# Patient Record
Sex: Male | Born: 2000 | Race: Black or African American | Hispanic: No | Marital: Single | Smoking: Never smoker
Health system: Southern US, Community
[De-identification: ages and names within clinical notes are randomized; demographics above are authoritative.]

## PROBLEM LIST (undated history)

## (undated) DIAGNOSIS — F840 Autistic disorder: Secondary | ICD-10-CM

## (undated) DIAGNOSIS — F909 Attention-deficit hyperactivity disorder, unspecified type: Secondary | ICD-10-CM

## (undated) DIAGNOSIS — F32A Depression, unspecified: Secondary | ICD-10-CM

## (undated) DIAGNOSIS — K5900 Constipation, unspecified: Secondary | ICD-10-CM

## (undated) DIAGNOSIS — T7840XA Allergy, unspecified, initial encounter: Secondary | ICD-10-CM

## (undated) DIAGNOSIS — E785 Hyperlipidemia, unspecified: Secondary | ICD-10-CM

## (undated) DIAGNOSIS — F419 Anxiety disorder, unspecified: Secondary | ICD-10-CM

## (undated) HISTORY — DX: Anxiety disorder, unspecified: F41.9

## (undated) HISTORY — DX: Allergy, unspecified, initial encounter: T78.40XA

## (undated) HISTORY — PX: ADENOIDECTOMY: SUR15

## (undated) HISTORY — DX: Hyperlipidemia, unspecified: E78.5

## (undated) HISTORY — DX: Depression, unspecified: F32.A

---
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
## 2018-09-03 ENCOUNTER — Telehealth: Payer: Self-pay

## 2018-09-03 ENCOUNTER — Ambulatory Visit: Payer: Medicaid Other | Attending: Family Medicine | Admitting: Family Medicine

## 2018-09-03 ENCOUNTER — Encounter: Payer: Self-pay | Admitting: Family Medicine

## 2018-09-03 VITALS — BP 113/76 | HR 91 | Temp 98.6°F | Resp 16 | Ht 68.0 in | Wt 160.4 lb

## 2018-09-03 DIAGNOSIS — E010 Iodine-deficiency related diffuse (endemic) goiter: Secondary | ICD-10-CM

## 2018-09-03 DIAGNOSIS — R05 Cough: Secondary | ICD-10-CM | POA: Diagnosis not present

## 2018-09-03 DIAGNOSIS — Z8249 Family history of ischemic heart disease and other diseases of the circulatory system: Secondary | ICD-10-CM | POA: Insufficient documentation

## 2018-09-03 DIAGNOSIS — Z23 Encounter for immunization: Secondary | ICD-10-CM | POA: Diagnosis not present

## 2018-09-03 DIAGNOSIS — F840 Autistic disorder: Secondary | ICD-10-CM | POA: Diagnosis not present

## 2018-09-03 DIAGNOSIS — J309 Allergic rhinitis, unspecified: Secondary | ICD-10-CM

## 2018-09-03 DIAGNOSIS — J4521 Mild intermittent asthma with (acute) exacerbation: Secondary | ICD-10-CM

## 2018-09-03 DIAGNOSIS — Z800 Family history of malignant neoplasm of digestive organs: Secondary | ICD-10-CM | POA: Diagnosis not present

## 2018-09-03 DIAGNOSIS — J069 Acute upper respiratory infection, unspecified: Secondary | ICD-10-CM

## 2018-09-03 DIAGNOSIS — Z833 Family history of diabetes mellitus: Secondary | ICD-10-CM | POA: Insufficient documentation

## 2018-09-03 DIAGNOSIS — F909 Attention-deficit hyperactivity disorder, unspecified type: Secondary | ICD-10-CM | POA: Insufficient documentation

## 2018-09-03 DIAGNOSIS — Z79899 Other long term (current) drug therapy: Secondary | ICD-10-CM

## 2018-09-03 DIAGNOSIS — R058 Other specified cough: Secondary | ICD-10-CM

## 2018-09-03 MED ORDER — MONTELUKAST SODIUM 10 MG PO TABS: 10.0000 mg | ORAL_TABLET | Freq: Every day | ORAL | 11 refills | Status: DC

## 2018-09-03 MED ORDER — CETIRIZINE HCL 10 MG PO TABS: 10.0000 mg | ORAL_TABLET | Freq: Every day | ORAL | 11 refills | Status: DC

## 2018-09-03 MED ORDER — AZITHROMYCIN 250 MG PO TABS: ORAL_TABLET | ORAL | 0 refills | Status: DC

## 2018-09-03 MED ORDER — PREDNISONE 20 MG PO TABS: ORAL_TABLET | ORAL | 0 refills | Status: DC

## 2018-09-03 MED ORDER — ALBUTEROL SULFATE HFA 108 (90 BASE) MCG/ACT IN AERS: 2.0000 | INHALATION_SPRAY | Freq: Four times a day (QID) | RESPIRATORY_TRACT | 11 refills | Status: DC | PRN

## 2018-09-03 NOTE — Telephone Encounter (Signed)
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 Pt is to arrive @345pm .

## 2018-09-03 NOTE — Patient Instructions (Addendum)
Td Vaccine (Tetanus and Diphtheria): What You Need to Know 1. Why get vaccinated? Tetanus  and diphtheria are very serious diseases. They are rare in the [place] today, but people who do become infected often have severe complications. Td vaccine is used to protect adolescents and adults from both of these diseases. Both tetanus and diphtheria are infections caused by bacteria. Diphtheria spreads from person to person through coughing or sneezing. Tetanus-causing bacteria enter the body through cuts, scratches, or wounds. TETANUS (lockjaw) causes painful muscle tightening and stiffness, usually all over the body.  It can lead to tightening of muscles in the head and neck so you can't open your mouth, swallow, or sometimes even breathe. Tetanus kills about 1 out of every 10 people who are infected even after receiving the best medical care.  DIPHTHERIA can cause a thick coating to form in the back of the throat.  It can lead to breathing problems, paralysis, heart failure, and death.  Before vaccines, as many as 200,000 cases of diphtheria and hundreds of cases of tetanus were reported in the [place] each year. Since vaccination began, reports of cases for both diseases have dropped by about 99%. 2. Td vaccine Td vaccine can protect adolescents and adults from tetanus and diphtheria. Td is usually given as a booster dose every 10 years but it can also be given earlier after a severe and dirty wound or burn. Another vaccine, called Tdap, which protects against pertussis in addition to tetanus and diphtheria, is sometimes recommended instead of Td vaccine. Your doctor or the person giving you the vaccine can give you more information. Td may safely be given at the same time as other vaccines. 3. Some people should not get this vaccine  A person who has ever had a life-threatening allergic reaction after a previous dose of any tetanus or diphtheria containing vaccine, OR has a severe  allergy to any part of this vaccine, should not get Td vaccine. Tell the person giving the vaccine about any severe allergies.  Talk to your doctor if you: ? had severe pain or swelling after any vaccine containing diphtheria or tetanus, ? ever had a condition called Guillain Barre Syndrome (GBS), ? aren't feeling well on the day the shot is scheduled. 4. What are the risks from Td vaccine? With any medicine, including vaccines, there is a chance of side effects. These are usually mild and go away on their own. Serious reactions are also possible but are rare. Most people who get Td vaccine do not have any problems with it. Mild problems following Td vaccine: (Did not interfere with activities)  Pain where the shot was given (about 8 people in 10)  Redness or swelling where the shot was given (about 1 person in 4)  Mild fever (rare)  Headache (about 1 person in 4)  Tiredness (about 1 person in 4)  Moderate problems following Td vaccine: (Interfered with activities, but did not require medical attention)  Fever over 102F (rare)  Severe problems following Td vaccine: (Unable to perform usual activities; required medical attention)  Swelling, severe pain, bleeding and/or redness in the arm where the shot was given (rare).  Problems that could happen after any vaccine:  People sometimes faint after a medical procedure, including vaccination. Sitting or lying down for about 15 minutes can help prevent fainting, and injuries caused by a fall. Tell your doctor if you feel dizzy, or have vision changes or ringing in the ears.  Some people get   severe pain in the shoulder and have difficulty moving the arm where a shot was given. This happens very rarely.  Any medication can cause a severe allergic reaction. Such reactions from a vaccine are very rare, estimated at fewer than 1 in a million doses, and would happen within a few minutes to a few hours after the vaccination. As with any  medicine, there is a very remote chance of a vaccine causing a serious injury or death. The safety of vaccines is always being monitored. For more information, visit: www.cdc.gov/vaccinesafety/ 5. What if there is a serious reaction? What should I look for? Look for anything that concerns you, such as signs of a severe allergic reaction, very high fever, or unusual behavior. Signs of a severe allergic reaction can include hives, swelling of the face and throat, difficulty breathing, a fast heartbeat, dizziness, and weakness. These would usually start a few minutes to a few hours after the vaccination. What should I do?  If you think it is a severe allergic reaction or other emergency that can't wait, call 9-1-1 or get the person to the nearest hospital. Otherwise, call your doctor.  Afterward, the reaction should be reported to the Vaccine Adverse Event Reporting System (VAERS). Your doctor might file this report, or you can do it yourself through the VAERS web site at www.vaers.hhs.gov, or by calling 1-800-822-7967. ? VAERS does not give medical advice. 6. The National Vaccine Injury Compensation Program The National Vaccine Injury Compensation Program (VICP) is a federal program that was created to compensate people who may have been injured by certain vaccines. Persons who believe they may have been injured by a vaccine can learn about the program and about filing a claim by calling 1-800-338-2382 or visiting the VICP website at www.hrsa.gov/vaccinecompensation. There is a time limit to file a claim for compensation. 7. How can I learn more?  Ask your doctor. He or she can give you the vaccine package insert or suggest other sources of information.  Call your local or state health department.  Contact the Centers for Disease Control and Prevention (CDC): ? Call 1-800-232-4636 (1-800-CDC-INFO) ? Visit CDC's website at www.cdc.gov/vaccines CDC Td Vaccine VIS (03/18/16) This information is  not intended to replace advice given to you by your health care provider. Make sure you discuss any questions you have with your health care provider. Document Released: 09/21/2006 Document Revised: 08/14/2016 Document Reviewed: 08/14/2016 Elsevier Interactive Patient Education  2017 Else[employer] Influenza Virus Vaccine injection (Fluarix) What is this medicine? INFLUENZA VIRUS VACCINE (in floo EN zuh VAHY ruhs vak SEEN) helps to reduce the risk of getting influenza also known as the flu. This medicine may be used for other purposes; ask your health care provider or pharmacist if you have questions. COMMON BRAND NAME(S): Fluarix, Fluzone What should I tell my health care provider before I take this medicine? They need to know if you have any of these conditions: -bleeding disorder like hemophilia -fever or infection -Guillain-Barre syndrome or other neurological problems -immune system problems -infection with the human immunodeficiency virus (HIV) or AIDS -low blood platelet counts -multiple sclerosis -an unusual or allergic reaction to influenza virus vaccine, eggs, chicken proteins, latex, gentamicin, other medicines, foods, dyes or preservatives -pregnant or trying to get pregnant -breast-feeding How should I use this medicine? This vaccine is for injection into a muscle. It is given by a health care professional. A copy of Vaccine Information Statements will be given before each vaccination. Read this sheet carefully each   time. The sheet may change frequently. Talk to your pediatrician regarding the use of this medicine in children. Special care may be needed. Overdosage: If you think you have taken too much of this medicine contact a poison control center or emergency room at once. NOTE: This medicine is only for you. Do not share this medicine with others. What if I miss a dose? This does not apply. What may interact with this medicine? -chemotherapy or radiation  therapy -medicines that lower your immune system like etanercept, anakinra, infliximab, and adalimumab -medicines that treat or prevent blood clots like warfarin -phenytoin -steroid medicines like prednisone or cortisone -theophylline -vaccines This list may not describe all possible interactions. Give your health care provider a list of all the medicines, herbs, non-prescription drugs, or dietary supplements you use. Also tell them if you smoke, drink alcohol, or use illegal drugs. Some items may interact with your medicine. What should I watch for while using this medicine? Report any side effects that do not go away within 3 days to your doctor or health care professional. Call your health care provider if any unusual symptoms occur within 6 weeks of receiving this vaccine. You may still catch the flu, but the illness is not usually as bad. You cannot get the flu from the vaccine. The vaccine will not protect against colds or other illnesses that may cause fever. The vaccine is needed every year. What side effects may I notice from receiving this medicine? Side effects that you should report to your doctor or health care professional as soon as possible: -allergic reactions like skin rash, itching or hives, swelling of the face, lips, or tongue Side effects that usually do not require medical attention (report to your doctor or health care professional if they continue or are bothersome): -fever -headache -muscle aches and pains -pain, tenderness, redness, or swelling at site where injected -weak or tired This list may not describe all possible side effects. Call your doctor for medical advice about side effects. You may report side effects to FDA at 1-800-FDA-1088. Where should I keep my medicine? This vaccine is only given in a clinic, pharmacy, doctor's office, or other health care setting and will not be stored at home. NOTE: This sheet is a summary. It may not cover all possible  information. If you have questions about this medicine, talk to your doctor, pharmacist, or health care provider.  2018 Elsevier/Gold Standard (2008-06-21 09:30:40)  Asthma, Adult Asthma is a condition of the lungs in which the airways tighten and narrow. Asthma can make it hard to breathe. Asthma cannot be cured, but medicine and lifestyle changes can help control it. Asthma may be started (triggered) by:  Animal skin flakes (dander).  Dust.  Cockroaches.  Pollen.  Mold.  Smoke.  Cleaning products.  Hair sprays or aerosol sprays.  Paint fumes or strong smells.  Cold air, weather changes, and winds.  Crying or laughing hard.  Stress.  Certain medicines or drugs.  Foods, such as dried fruit, potato chips, and sparkling grape juice.  Infections or conditions (colds, flu).  Exercise.  Certain medical conditions or diseases.  Exercise or tiring activities.  Follow these instructions at home:  Take medicine as told by your doctor.  Use a peak flow meter as told by your doctor. A peak flow meter is a tool that measures how well the lungs are working.  Record and keep track of the peak flow meter's readings.  Understand and use the asthma action plan.  An asthma action plan is a written plan for taking care of your asthma and treating your attacks.  To help prevent asthma attacks: ? Do not smoke. Stay away from secondhand smoke. ? Change your heating and air conditioning filter often. ? Limit your use of fireplaces and wood stoves. ? Get rid of pests (such as roaches and mice) and their droppings. ? Throw away plants if you see mold on them. ? Clean your floors. Dust regularly. Use cleaning products that do not smell. ? Have someone vacuum when you are not home. Use a vacuum cleaner with a HEPA filter if possible. ? Replace carpet with wood, tile, or vinyl flooring. Carpet can trap animal skin flakes and dust. ? Use allergy-proof pillows, mattress covers, and box  spring covers. ? Wash bed sheets and blankets every week in hot water and dry them in a dryer. ? Use blankets that are made of polyester or cotton. ? Clean bathrooms and kitchens with bleach. If possible, have someone repaint the walls in these rooms with mold-resistant paint. Keep out of the rooms that are being cleaned and painted. ? Wash hands often. Contact a doctor if:  You have make a whistling sound when breaking (wheeze), have shortness of breath, or have a cough even if taking medicine to prevent attacks.  The colored mucus you cough up (sputum) is thicker than usual.  The colored mucus you cough up changes from clear or white to yellow, green, gray, or bloody.  You have problems from the medicine you are taking such as: ? A rash. ? Itching. ? Swelling. ? Trouble breathing.  You need reliever medicines more than 2-3 times a week.  Your peak flow measurement is still at 50-79% of your personal best after following the action plan for 1 hour.  You have a fever. Get help right away if:  You seem to be worse and are not responding to medicine during an asthma attack.  You are short of breath even at rest.  You get short of breath when doing very little activity.  You have trouble eating, drinking, or talking.  You have chest pain.  You have a fast heartbeat.  Your lips or fingernails start to turn blue.  You are light-headed, dizzy, or faint.  Your peak flow is less than 50% of your personal best. This information is not intended to replace advice given to you by your health care provider. Make sure you discuss any questions you have with your health care provider. Document Released: 05/12/2008 Document Revised: 05/01/2016 Document Reviewed: 06/23/2013 Elsevier Interactive Patient Education  2017 [employer].

## 2018-09-03 NOTE — Progress Notes (Signed)
Subjective:    Patient ID: Jesse Rhodes, male    DOB: 10-Mar-2001, 17 y.o.   MRN: [id number]  HPI 17 year old male who is new to the practice who presents to establish a primary care physician.  Patient is accompanied by his mother at today's visit.  Mother reports that the patient has a history of asthma, high functioning autism and attention deficit disorder.  Patient is seen by an asthma specialist as well as a specialist for his ADD.      Patient states that today's visit that he has had recurrent dry cough for a while but now is starting to cough up something and also felt last night as if he is having some wheezing.  Patient has had some nasal congestion and clear nasal discharge as well as sensation of postnasal drainage with mild throat irritation but no sore throat.   Patient with complaint of some mild sensation of chest tightness.  Per mother, refills are needed of albuterol and cetirizine.  Patient states that he has been using his albuterol more often.  Mother reports that patient had been on Qvar as well for his asthma in the past.  Patient was tried on Advair but had onset of rash and itching.  Upon questioning, patient was also on Singulair in the past and this did seem to help with his asthma and allergies.  Mother states that patient's only surgery has been removal of his adenoids when he was very young.  Patient has family history significant for his mother having diabetes and hypertension, maternal grandfather with hypertension and diabetes, maternal grandmother with hypertension and patient has 2 maternal uncles with diabetes.  Patient's mother states that her brother ( patient's maternal uncle) also had colon cancer at the age of 37.  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  Patient denies any smoking or alcohol use. Past Medical History:  Diagnosis Date  . ADHD (attention deficit hyperactivity disorder)   . Asthma     . Autism   . Constipation    Past Surgical History:  Procedure Laterality Date  . ADENOIDECTOMY     Social History   Tobacco Use  . Smoking status: Never Smoker  Substance Use Topics  . Alcohol use: No  . Drug use: No   Allergies  Allergen Reactions  . Advair Diskus [Fluticasone-Salmeterol] Rash  . Chocolate Hives, Itching and Rash  . Penicillins Rash     Review of Systems  Constitutional: Negative for chills and fever.  HENT: Positive for congestion and rhinorrhea. Negative for ear pain, sore throat and trouble swallowing.   Respiratory: Positive for cough, chest tightness, shortness of breath and wheezing.   Cardiovascular: Negative for chest pain, palpitations and leg swelling.  Gastrointestinal: Positive for constipation (occasional). Negative for abdominal pain and nausea.  Genitourinary: Negative for dysuria and frequency.  Musculoskeletal: Negative for back pain, gait problem and joint swelling.  Neurological: Negative for dizziness and headaches.       Objective:   Physical Exam BP 113/76   Pulse 91   Temp 98.6 F (37 C) (Oral)   Resp 16   Ht 5\' 8"  (1.727 m)   Wt 160 lb 6.4 oz (72.8 kg)   SpO2 98%   BMI 24.39 kg/m  vital signs and nurses notes reviewed General-well-nourished, well-developed young adult male in no acute distress.  Patient is accompanied by his mother at today's visit. ENT-TMs light pink, right greater  than left but landmarks are visible, nares with moderate edema of the nasal turbinates patient with clear to white nasal discharge, patient with mild posterior pharynx erythema Neck-supple, no lymphadenopathy, patient with thyroid fullness Cardiovascular-regular rate and rhythm Lungs-patient with a mild decrease in air movement and mild decrease in breath sounds but no wheeze and no accessory muscle use, breathing is unlabored Cardiovascular-regular rate and rhythm Abdomen-soft, nontender Back-no CVA tenderness Extremities-no edema      Assessment & Plan:  7. Need for immunization against influenza Patient was offered and received influenza immunization at today's visit. - Flu Vaccine QUAD 36+ mos IM  1. Mild intermittent asthma with exacerbation Patient with mild intermittent asthma but appears to have had a recent exacerbation as patient with complaint of dry cough and now with sensation of chest tightness and wheezing over the past few days.  Patient will be prescribed a prednisone taper which his mother states he has taken in the past with good results.  Patient will also be restarted on Singulair which mother states he has taken in the past and this is helped with asthma and allergies.  Mother states that she does have an appointment with his allergist but this is not until October.  Patient is given a refill of his albuterol.  If patient has onset of fever, productive nasal discharge or productive sputum with cough, prescription sent in for azithromycin Z-Pak and patient should follow-up next week for further evaluation. - montelukast (SINGULAIR) 10 MG tablet; Take 1 tablet (10 mg total) by mouth at bedtime.  Dispense: 30 tablet; Refill: 11 - CBC with Differential - Comprehensive metabolic panel - predniSONE (DELTASONE) 20 MG tablet; 2 pills once daily for 2 days then 1 pill daily for 2 days then 1/2 pill daily for 4 days; take after eating  Dispense: 8 tablet; Refill: 0 - albuterol (PROVENTIL HFA;VENTOLIN HFA) 108 (90 Base) MCG/ACT inhaler; Inhale 2 puffs into the lungs every 6 (six) hours as needed. For wheeze or shortness of breath  Dispense: 2 Inhaler; Refill: 11 - azithromycin (ZITHROMAX) 250 MG tablet; 2 pills on the first day then one pill daily for 4 days  Dispense: 6 tablet; Refill: 0  2. Recurrent dry cough Patient's cough is likely related to his current asthma exacerbation and/or current URI as well as chronic issues with allergic rhinitis.  Patient will be treated for asthma exacerbation with prednisone taper and  refill given of cetirizine for allergic rhinitis and patient restarted on Singulair.  Patient may need chest x-ray if cough does not resolve within the next few weeks.  3. URI with cough and congestion Patient with an upper respiratory infection with cough and congestion.  Prescription provided for cetirizine to help with allergic rhinitis and patient is being placed back on a Singulair for asthma and allergies.  Patient is also being treated for his asthma exacerbation.  Patient may also take an over-the-counter cough medicine such as Robitussin-DM if needed.  4. Allergic rhinitis, unspecified seasonality, unspecified trigger Prescription sent to patient's pharmacy for refill of cetirizine and patient will be restarted on Singulair.  5. Thyromegaly Patient with thyroid fullness and possible thyromegaly on exam.  Patient will have an ultrasound of the thyroid scheduled and will have TSH and free T4.  Patient will be notified of the results and if any further treatment is needed based on these results - US THYROID; Future - TSH + free T4  6. Encounter for long-term (current) use of medications Patient will have CMP  and CBC done a follow-up of long-term use of medications for treatment of multiple medical issues including his asthma and ADD. - CBC with Differential - Comprehensive metabolic panel  An After Visit Summary was printed and given to the patient.  Return in about 6 weeks (around 10/15/2018), or if symptoms worsen or fail to improve, for sooner if needed.

## 2018-09-04 LAB — CBC WITH DIFFERENTIAL/PLATELET
Basophils Absolute: 0 x10E3/uL (ref 0.0–0.2)
Basos: 1 %
EOS (ABSOLUTE): 0.1 x10E3/uL (ref 0.0–0.4)
Eos: 2 %
Hematocrit: 46.1 % (ref 37.5–51.0)
Hemoglobin: 15.9 g/dL (ref 13.0–17.7)
Immature Grans (Abs): 0 x10E3/uL (ref 0.0–0.1)
Immature Granulocytes: 0 %
Lymphocytes Absolute: 1.4 x10E3/uL (ref 0.7–3.1)
Lymphs: 47 %
MCH: 29.3 pg (ref 26.6–33.0)
MCHC: 34.5 g/dL (ref 31.5–35.7)
MCV: 85 fL (ref 79–97)
Monocytes Absolute: 0.4 x10E3/uL (ref 0.1–0.9)
Monocytes: 12 %
Neutrophils Absolute: 1.1 x10E3/uL — ABNORMAL LOW (ref 1.4–7.0)
Neutrophils: 38 %
Platelets: 235 x10E3/uL (ref 150–450)
RBC: 5.42 x10E6/uL (ref 4.14–5.80)
RDW: 13.1 % (ref 12.3–15.4)
WBC: 3 x10E3/uL — ABNORMAL LOW (ref 3.4–10.8)

## 2018-09-04 LAB — COMPREHENSIVE METABOLIC PANEL WITH GFR
ALT: 23 IU/L (ref 0–44)
AST: 19 IU/L (ref 0–40)
Albumin/Globulin Ratio: 2.5 — ABNORMAL HIGH (ref 1.2–2.2)
Albumin: 4.8 g/dL (ref 3.5–5.5)
Alkaline Phosphatase: 64 IU/L (ref 39–117)
BUN/Creatinine Ratio: 16 (ref 9–20)
BUN: 16 mg/dL (ref 6–20)
Bilirubin Total: 0.6 mg/dL (ref 0.0–1.2)
CO2: 24 mmol/L (ref 20–29)
Calcium: 9.6 mg/dL (ref 8.7–10.2)
Chloride: 102 mmol/L (ref 96–106)
Creatinine, Ser: 1.01 mg/dL (ref 0.76–1.27)
GFR calc Af Amer: 124 mL/min/1.73
GFR calc non Af Amer: 107 mL/min/1.73
Globulin, Total: 1.9 g/dL (ref 1.5–4.5)
Glucose: 69 mg/dL (ref 65–99)
Potassium: 4.2 mmol/L (ref 3.5–5.2)
Sodium: 143 mmol/L (ref 134–144)
Total Protein: 6.7 g/dL (ref 6.0–8.5)

## 2018-09-04 LAB — TSH+FREE T4
Free T4: 1.11 ng/dL (ref 0.93–1.60)
TSH: 4.13 u[IU]/mL (ref 0.450–4.500)

## 2018-09-07 ENCOUNTER — Ambulatory Visit (HOSPITAL_COMMUNITY)
Admission: RE | Admit: 2018-09-07 | Discharge: 2018-09-07 | Disposition: A | Discharge status: Home / Self Care | Payer: Medicaid Other | Source: Ambulatory Visit | Attending: Family Medicine | Admitting: Family Medicine

## 2018-09-07 ENCOUNTER — Telehealth: Payer: Self-pay

## 2018-09-07 DIAGNOSIS — E010 Iodine-deficiency related diffuse (endemic) goiter: Secondary | ICD-10-CM | POA: Diagnosis present

## 2018-09-07 NOTE — Telephone Encounter (Signed)
I would still like for her to get the US done. If it was not done today then she can get this rescheduled at her convience

## 2018-09-07 NOTE — Telephone Encounter (Signed)
Patient was called, spoke to the patients mother, she verified the patients dob. Results were given, mother verbalized understanding, and she had questioned if she still needed to take him to his thyroid US appointment today at 4pm due to thyroid blood test being normal? Please fu at your earliest convenience.

## 2018-09-07 NOTE — Telephone Encounter (Signed)
-----   Message from Cain Saupe, MD sent at 09/06/2018  9:34 PM EDT ----- Please notify patient's mother that patient has a low wbc count on his CBC at 3.0 (normal 3.4-10.8); can she bring patient in for lab visit to repeat CBC sometime this week or next week? (Let me know so that I can place the lab order). BMP and thyroid blood tests were normal

## 2018-09-07 NOTE — Telephone Encounter (Signed)
[place] was completed today.

## 2018-09-07 NOTE — Telephone Encounter (Signed)
-----   Message from Cain Saupe, MD sent at 09/07/2018  5:13 PM EDT ----- Please let patient's mom know that his thyroid US was normal

## 2018-09-07 NOTE — Telephone Encounter (Signed)
Patients mother was called and informed of the patients [place] results. She verbalized understanding and had no further questions.

## 2018-09-13 ENCOUNTER — Ambulatory Visit: Payer: Medicaid Other | Attending: Family Medicine

## 2018-09-13 DIAGNOSIS — Z79899 Other long term (current) drug therapy: Secondary | ICD-10-CM | POA: Insufficient documentation

## 2018-09-13 DIAGNOSIS — J4521 Mild intermittent asthma with (acute) exacerbation: Secondary | ICD-10-CM | POA: Diagnosis present

## 2018-09-13 NOTE — Progress Notes (Signed)
Patient here for lab visit only 

## 2018-09-14 LAB — CBC WITH DIFFERENTIAL/PLATELET
Basophils Absolute: 0 x10E3/uL (ref 0.0–0.2)
Basos: 1 %
EOS (ABSOLUTE): 0.1 x10E3/uL (ref 0.0–0.4)
Eos: 3 %
Hematocrit: 44.9 % (ref 37.5–51.0)
Hemoglobin: 16 g/dL (ref 13.0–17.7)
Immature Grans (Abs): 0 x10E3/uL (ref 0.0–0.1)
Immature Granulocytes: 0 %
Lymphocytes Absolute: 1.9 x10E3/uL (ref 0.7–3.1)
Lymphs: 49 %
MCH: 30.6 pg (ref 26.6–33.0)
MCHC: 35.6 g/dL (ref 31.5–35.7)
MCV: 86 fL (ref 79–97)
Monocytes Absolute: 0.4 x10E3/uL (ref 0.1–0.9)
Monocytes: 10 %
Neutrophils Absolute: 1.4 x10E3/uL (ref 1.4–7.0)
Neutrophils: 37 %
Platelets: 210 x10E3/uL (ref 150–450)
RBC: 5.23 x10E6/uL (ref 4.14–5.80)
RDW: 13.2 % (ref 12.3–15.4)
WBC: 3.9 x10E3/uL (ref 3.4–10.8)

## 2018-09-17 ENCOUNTER — Telehealth: Payer: Self-pay | Admitting: *Deleted

## 2018-09-17 NOTE — Telephone Encounter (Signed)
-----   Message from Cain Saupe, MD sent at 09/16/2018  2:40 PM EDT ----- Notify patient's mom that his CBC is normal

## 2018-09-17 NOTE — Telephone Encounter (Signed)
Patient's mother verified DOB. Aware of blood count being normal.  No further questions.

## 2018-10-11 ENCOUNTER — Ambulatory Visit: Payer: Medicaid Other | Admitting: Family Medicine

## 2018-10-21 ENCOUNTER — Ambulatory Visit: Payer: Medicaid Other | Attending: Family Medicine | Admitting: Family Medicine

## 2018-10-21 ENCOUNTER — Encounter: Payer: Self-pay | Admitting: Family Medicine

## 2018-10-21 VITALS — BP 131/78 | HR 76 | Temp 97.9°F | Ht 68.0 in | Wt 163.0 lb

## 2018-10-21 DIAGNOSIS — H6693 Otitis media, unspecified, bilateral: Secondary | ICD-10-CM

## 2018-10-21 DIAGNOSIS — Z79899 Other long term (current) drug therapy: Secondary | ICD-10-CM | POA: Diagnosis not present

## 2018-10-21 DIAGNOSIS — J4541 Moderate persistent asthma with (acute) exacerbation: Secondary | ICD-10-CM

## 2018-10-21 DIAGNOSIS — F840 Autistic disorder: Secondary | ICD-10-CM | POA: Diagnosis not present

## 2018-10-21 DIAGNOSIS — Z8249 Family history of ischemic heart disease and other diseases of the circulatory system: Secondary | ICD-10-CM | POA: Insufficient documentation

## 2018-10-21 DIAGNOSIS — R05 Cough: Secondary | ICD-10-CM | POA: Diagnosis not present

## 2018-10-21 DIAGNOSIS — J45909 Unspecified asthma, uncomplicated: Secondary | ICD-10-CM | POA: Insufficient documentation

## 2018-10-21 DIAGNOSIS — F909 Attention-deficit hyperactivity disorder, unspecified type: Secondary | ICD-10-CM | POA: Diagnosis not present

## 2018-10-21 DIAGNOSIS — R058 Other specified cough: Secondary | ICD-10-CM

## 2018-10-21 MED ORDER — AZITHROMYCIN 250 MG PO TABS: ORAL_TABLET | ORAL | 0 refills | Status: DC

## 2018-10-21 MED ORDER — PREDNISONE 20 MG PO TABS: 20.0000 mg | ORAL_TABLET | Freq: Every day | ORAL | 1 refills | Status: DC

## 2018-10-21 NOTE — Patient Instructions (Signed)
Ask allergist about an inhaled steroid to help with patient's recurrent cough and chest tightness

## 2018-10-21 NOTE — Progress Notes (Signed)
Patient stated his chest tightness is still there and cough.

## 2018-10-21 NOTE — Progress Notes (Signed)
Subjective:    Patient ID: Jesse Rhodes, male    DOB: [birth year], 17 y.o.   MRN: [id number]  HPI 17 year old male who presents in follow-up of asthma as patient with continued complaint of cough and chest tightness per CMA.  Patient's mother states the patient was seen by his allergist since his last visit here.  Mother states that the allergist believes that the cough may be related to environmental exposure.  Mother states that allergy shots have been recommended.       Patient reports that he continues to have a daily, nonproductive cough along with chest tightness.  Patient uses his albuterol inhaler several times per day.  Patient has restarted the use of Singulair since his last visit.  Patient does have some nasal congestion but does not feel as if he has any drainage down the back of his throat.  Patient denies any ear pain and no sore throat.  Past Medical History:  Diagnosis Date  . ADHD (attention deficit hyperactivity disorder)   . Asthma   . Autism   . Constipation    Past Surgical History:  Procedure Laterality Date  . ADENOIDECTOMY     Family History  Problem Relation Age of Onset  . Diabetes Mother   . Hypertension Mother    Social History   Tobacco Use  . Smoking status: Never Smoker  . Smokeless tobacco: Never Used  Substance Use Topics  . Alcohol use: Never    Frequency: Never  . Drug use: Never   Allergies  Allergen Reactions  . Advair Diskus [Fluticasone-Salmeterol] Rash  . Chocolate Hives, Itching and Rash  . Penicillins Rash    Review of Systems  Constitutional: Negative for chills, fatigue and fever.  HENT: Positive for congestion. Negative for ear pain, postnasal drip, rhinorrhea, sinus pressure, sinus pain, sore throat and trouble swallowing.   Respiratory: Positive for cough, chest tightness, shortness of breath (occasional) and wheezing.   Cardiovascular: Negative for chest pain, palpitations and leg swelling.  Gastrointestinal: Negative for  abdominal pain and nausea.  Endocrine: Negative for polydipsia, polyphagia and polyuria.  Genitourinary: Negative for dysuria and frequency.  Neurological: Negative for dizziness and headaches.       Objective:   Physical Exam BP 131/78 (BP Location: Right Arm, Patient Position: Sitting, Cuff Size: Normal)   Pulse 76   Temp 97.9 F (36.6 C) (Oral)   Ht 5\' 8"  (1.727 m)   Wt 163 lb (73.9 kg)   SpO2 99%   BMI 24.78 kg/m  Nurse's notes and vital signs reviewed General-well-nourished, well-developed young adult male accompanied by his mother at today's visit ENT- TMs are dark pink bilaterally and slightly thickened and no visible landmarks, right greater than left.  Patient with edema/erythema of the nasal turbinates with scant left-sided yellow nasal discharge, Patient with posterior pharynx/tonsillar arch edema/erythema and cobblestoning Neck-supple, patient with anterior neck fat padding, no LAD Lungs- patient with some decrease in air movement, no audible wheeze, no increased work of breathing Cardiovascular-regular rate and rhythm Abdomen-soft, nontender       Assessment & Plan:  1. Asthma in adult, moderate persistent, with acute exacerbation Patient with continued complaints of increased use of albuterol inhaler, chest tightness and cough.  Mother states that allergist is considering allergy shots.  Also discussed with mother that patient may benefit from an inhaled steroid and she should discuss this with the allergist.  Patient has had prior allergy to Advair which resulted in a rash.  Patient however  has been able to tolerate prednisone therefore can likely use an inhaled steroid which would help with his chest tightness.  Patient is prescribed a short course of prednisone for current chest tightness.  2. Recurrent cough Cough is likely secondary to combination of airway irritation and allergic rhinitis.  Mother reports that they have seen the allergist last month and allergist  is considering allergy shots.  However also suggested that patient's mother contact the allergist to see if patient might be able to tolerate an inhaled steroid to help  3. Bilateral otitis media, unspecified otitis media type Patient with bilateral otitis media but is asymptomatic.  Patient's mother was told that she may want to monitor patient's temperature and give antibiotic if he is running a fever or if he starts complaining of the ear pain.  Prescription was sent into patient's pharmacy for azithromycin Z-Pak for treatment of otitis media.  An After Visit Summary was printed and given to the patient.  Return in about 2 weeks (around 11/04/2018) for Asthma/ear infection-1 to 2 weeks if not better.

## 2018-11-01 ENCOUNTER — Ambulatory Visit: Payer: Medicaid Other | Attending: Family Medicine | Admitting: Family Medicine

## 2018-11-01 VITALS — BP 119/74 | HR 85 | Temp 98.1°F | Ht 68.0 in | Wt 163.6 lb

## 2018-11-01 DIAGNOSIS — F909 Attention-deficit hyperactivity disorder, unspecified type: Secondary | ICD-10-CM | POA: Diagnosis not present

## 2018-11-01 DIAGNOSIS — J069 Acute upper respiratory infection, unspecified: Secondary | ICD-10-CM | POA: Diagnosis not present

## 2018-11-01 DIAGNOSIS — J4541 Moderate persistent asthma with (acute) exacerbation: Secondary | ICD-10-CM | POA: Diagnosis not present

## 2018-11-01 DIAGNOSIS — Z888 Allergy status to other drugs, medicaments and biological substances status: Secondary | ICD-10-CM | POA: Diagnosis not present

## 2018-11-01 DIAGNOSIS — F840 Autistic disorder: Secondary | ICD-10-CM | POA: Insufficient documentation

## 2018-11-01 DIAGNOSIS — Z79899 Other long term (current) drug therapy: Secondary | ICD-10-CM | POA: Insufficient documentation

## 2018-11-01 DIAGNOSIS — J45909 Unspecified asthma, uncomplicated: Secondary | ICD-10-CM | POA: Diagnosis present

## 2018-11-01 DIAGNOSIS — Z880 Allergy status to penicillin: Secondary | ICD-10-CM | POA: Diagnosis not present

## 2018-11-01 DIAGNOSIS — J309 Allergic rhinitis, unspecified: Secondary | ICD-10-CM

## 2018-11-01 DIAGNOSIS — R05 Cough: Secondary | ICD-10-CM | POA: Diagnosis not present

## 2018-11-01 DIAGNOSIS — R053 Chronic cough: Secondary | ICD-10-CM

## 2018-11-01 MED ORDER — CETIRIZINE HCL 10 MG PO TABS: 10.0000 mg | ORAL_TABLET | Freq: Every day | ORAL | 11 refills | Status: DC

## 2018-11-01 MED ORDER — BENZONATATE 100 MG PO CAPS: 100.0000 mg | ORAL_CAPSULE | Freq: Two times a day (BID) | ORAL | 0 refills | Status: DC | PRN

## 2018-11-01 MED ORDER — PREDNISONE 20 MG PO TABS: ORAL_TABLET | ORAL | 0 refills | Status: DC

## 2018-11-01 NOTE — Progress Notes (Signed)
Subjective:    Patient ID: Jesse Rhodes, male    DOB: 11-Oct-2001, 17 y.o.   MRN: [id number]  HPI       17 year old autistic male seen in follow-up of asthma and chronic cough.  Patient is accompanied by his mother at today's visit.  Both patient and mother state that patient has continued to have a recurrent cough.  Mother did contact patient's allergist and patient is now on Flovent 110 mcg as well as the Singulair.  Patient states that he has not felt a big difference as he continues to cough.  Patient has not coughed up anything.  Patient denies fever or chills.  Patient has to use his albuterol inhaler several times per day.  Mother states that both she and the allergist believes that patient may have a work-related exposure notices contributing to his recurrent cough.  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  Patient denies any current ear pain or sore throat.  Patient does have nasal congestion and postnasal drainage but takes cetirizine.  Past Medical History:  Diagnosis Date  . ADHD (attention deficit hyperactivity disorder)   . Asthma   . Autism   . Constipation    Past Surgical History:  Procedure Laterality Date  . ADENOIDECTOMY     Family History  Problem Relation Age of Onset  . Diabetes Mother   . Hypertension Mother    Social History   Tobacco Use  . Smoking status: Never Smoker  . Smokeless tobacco: Never Used  Substance Use Topics  . Alcohol use: Never    Frequency: Never  . Drug use: Never   Allergies  Allergen Reactions  . Advair Diskus [Fluticasone-Salmeterol] Rash  . Chocolate Hives, Itching and Rash  . Penicillins Rash   Current Outpatient Medications on File Prior to Visit  Medication Sig Dispense Refill  . albuterol (PROVENTIL HFA;VENTOLIN HFA) 108 (90 Base) MCG/ACT inhaler Inhale 2 puffs into the lungs every 6 (six) hours as needed. For wheeze or shortness of breath 2 Inhaler 11  .  albuterol (PROVENTIL) (2.5 MG/3ML) 0.083% nebulizer solution Take 2.5 mg by nebulization every 6 (six) hours as needed. For shortness of breath     . montelukast (SINGULAIR) 10 MG tablet Take 1 tablet (10 mg total) by mouth at bedtime. 30 tablet 11  . azithromycin (ZITHROMAX) 250 MG tablet 2 pills on the first day then one pill daily for 4 days (Patient not taking: Reported on 11/01/2018) 6 tablet 0  . azithromycin (ZITHROMAX) 250 MG tablet Take 2 pills on the first day then 1 pill daily for the next 4 days (Patient not taking: Reported on 11/01/2018) 6 tablet 0  . methocarbamol (ROBAXIN) 500 MG tablet Take 1 tablet (500 mg total) by mouth 2 (two) times daily. (Patient not taking: Reported on 09/03/2018) 20 tablet 0  . methylphenidate (CONCERTA) 27 MG CR tablet Take 27 mg by mouth every morning.     . Pediatric Multiple Vit-C-FA (MULTIVITAMIN ANIMAL SHAPES, WITH CA/FA,) WITH C & FA CHEW Chew 1 tablet by mouth daily.    . predniSONE (DELTASONE) 20 MG tablet Take 1 tablet (20 mg total) by mouth daily with breakfast. (Patient not taking: Reported on 11/01/2018) 8 tablet 1   No current facility-administered medications on file prior to visit.       Review of Systems  Constitutional: Positive for fatigue. Negative for chills and fever.  HENT: Positive for congestion. Negative for  postnasal drip and rhinorrhea.   Respiratory: Positive for cough and chest tightness. Negative for shortness of breath.   Cardiovascular: Negative for chest pain, palpitations and leg swelling.  Gastrointestinal: Negative for abdominal distention and nausea.  Genitourinary: Negative for dysuria and frequency.  Musculoskeletal: Negative for arthralgias, back pain and myalgias.  Neurological: Negative for dizziness and headaches.       Objective:   Physical Exam BP 119/74   Pulse (!) 108   Temp 98.1 F (36.7 C) (Oral)   Ht 5\' 8"  (1.727 m)   Wt 163 lb 9.6 oz (74.2 kg)   SpO2 93%   BMI 24.88 kg/m  Nurse's notes and  vital signs reviewed General-well-nourished, well-developed young adult male in no acute distress sitting on exam table, patient does appear slightly fatigued.  Patient does not cough during his time in the exam room.  Patient is accompanied by his mother at today's visit ENT- right TM is light pink but landmarks are visible, nares with marked edema of the nasal turbinates with mild erythema and patient with thicker mucoid clear to grayish discharge in the canals, patient with tonsillar arch edema/erythema and posterior pharynx cobblestoning.  Neck-supple, no cervical lymphadenopathy, patient does have some mild thyroid fullness but no tenderness Lungs- patient with mild decreased breath sounds and very mild coarse breath sounds without wheeze, no increased work of breathing Cardiovascular-regular rate and rhythm Abdomen-soft, nontender Back-no CVA tenderness Extremities-no edema        Assessment & Plan:  1. Asthma in adult, moderate persistent, with acute exacerbation Patient unfortunately continues to have a chronic cough as well as chest tightness.  Patient appears to currently have an upper respiratory infection which may be contributing to his exacerbation.  Prescription provided for patient to be placed on a short prednisone taper and patient will continue his current medications and continue follow-up with his allergist.  Patient will have upcoming winter break from work and mother may be able to observe if his symptoms improve when he is not being exposed to work-related/environmental triggers.  2. Chronic cough Patient with chronic cough which he believes may be worse during work hours.  [facility] and patient will be able to take to see if this helps to decrease his cough during work hours. - benzonatate (TESSALON) 100 MG capsule; Take 1 capsule (100 mg total) by mouth 2 (two) times daily as needed for cough.  Dispense: 20 capsule; Refill: 0  3. URI with  cough and congestion Patient with URI with cough and congestion.  Patient will continue the use of Zyrtec to help with postnasal drainage but may also consider a few days of a decongestant to help with postnasal drainage.  Patient should also rest and remain well-hydrated.  4. Allergic rhinitis, unspecified seasonality, unspecified trigger Patient provided with refill of Zyrtec to take at bedtime to help with postnasal drainage and nasal congestion. - cetirizine (ZYRTEC) 10 MG tablet; Take 1 tablet (10 mg total) by mouth at bedtime.  Dispense: 30 tablet; Refill: 11  An After Visit Summary was printed and given to the patient.  Return in about 3 weeks (around 11/22/2018) for cough if not improved.

## 2018-11-04 ENCOUNTER — Encounter: Payer: Self-pay | Admitting: Family Medicine

## 2018-11-29 ENCOUNTER — Encounter: Payer: Self-pay | Admitting: Family Medicine

## 2018-11-29 ENCOUNTER — Ambulatory Visit: Payer: Medicaid Other | Attending: Family Medicine | Admitting: Family Medicine

## 2018-11-29 VITALS — BP 135/61 | HR 77 | Temp 98.5°F | Resp 18 | Ht 69.0 in | Wt 160.0 lb

## 2018-11-29 DIAGNOSIS — F909 Attention-deficit hyperactivity disorder, unspecified type: Secondary | ICD-10-CM | POA: Diagnosis not present

## 2018-11-29 DIAGNOSIS — Z833 Family history of diabetes mellitus: Secondary | ICD-10-CM | POA: Insufficient documentation

## 2018-11-29 DIAGNOSIS — Z880 Allergy status to penicillin: Secondary | ICD-10-CM | POA: Diagnosis not present

## 2018-11-29 DIAGNOSIS — Z13220 Encounter for screening for lipoid disorders: Secondary | ICD-10-CM

## 2018-11-29 DIAGNOSIS — R05 Cough: Secondary | ICD-10-CM | POA: Diagnosis not present

## 2018-11-29 DIAGNOSIS — J3089 Other allergic rhinitis: Secondary | ICD-10-CM | POA: Insufficient documentation

## 2018-11-29 DIAGNOSIS — F840 Autistic disorder: Secondary | ICD-10-CM | POA: Diagnosis not present

## 2018-11-29 DIAGNOSIS — Z79899 Other long term (current) drug therapy: Secondary | ICD-10-CM

## 2018-11-29 DIAGNOSIS — J4540 Moderate persistent asthma, uncomplicated: Secondary | ICD-10-CM | POA: Diagnosis present

## 2018-11-29 DIAGNOSIS — R053 Chronic cough: Secondary | ICD-10-CM | POA: Insufficient documentation

## 2018-11-29 NOTE — Progress Notes (Signed)
Subjective:    Patient ID: Jesse Rhodes, male    DOB: 2001-08-20, 17 y.o.   MRN: [id number]  HPI       17 year old male seen in follow-up of asthma and allergic rhinitis.  Patient continues to have chronic, recurrent cough.  Cough is nonproductive.  Patient denies any current issues with wheezing, chest tightness or shortness of breath.  Patient is receiving allergy shots once per week per mom.  Patient states that he feels better after his allergy shots.  Mother states that he has increased nasal congestion, runny nose after his allergy shots and she has been told that she can give the patient Benadryl to help with the symptoms. Mother states that allergist has said that they may not be sure if the allergy shots have been effective for a few months.  Patient otherwise has no new symptoms at today's visit.  Patient continues to take an inhaled corticosteroid steroid as well as his other medications for allergy and asthma.  Patient has family history of mother with diabetes and mother states that she brought patient in fasting at today's visit so that he could have blood work done in follow-up of medication use and to check for diabetes as well as to check his cholesterol.  Patient and mother have noticed no decrease in patient's cough with the patient being on winter break from his job at a [employer].  Past Medical History:  Diagnosis Date  . ADHD (attention deficit hyperactivity disorder)   . Asthma   . Autism   . Constipation    Past Surgical History:  Procedure Laterality Date  . ADENOIDECTOMY     Family History  Problem Relation Age of Onset  . Diabetes Mother   . Hypertension Mother    Social History   Tobacco Use  . Smoking status: Never Smoker  . Smokeless tobacco: Never Used  Substance Use Topics  . Alcohol use: Never    Frequency: Never  . Drug use: Never   Allergies  Allergen Reactions  . Advair Diskus [Fluticasone-Salmeterol] Rash  . Chocolate Hives, Itching  and Rash  . Penicillins Rash      Review of Systems  Constitutional: Negative for chills, fatigue and fever.  HENT: Positive for congestion, postnasal drip, rhinorrhea and sneezing (occasional). Negative for nosebleeds, sinus pressure, sinus pain, sore throat and trouble swallowing.   Respiratory: Positive for cough. Negative for shortness of breath and wheezing.   Cardiovascular: Negative for chest pain, palpitations and leg swelling.  Gastrointestinal: Negative for abdominal pain, constipation, diarrhea and nausea.  Genitourinary: Negative for dysuria and frequency.  Musculoskeletal: Negative for arthralgias and back pain.  Allergic/Immunologic: Positive for environmental allergies. Negative for food allergies.  Neurological: Negative for dizziness and headaches.       Objective:   Physical Exam BP 135/61 (BP Location: Right Arm, Patient Position: Sitting, Cuff Size: Normal)   Pulse 77   Temp 98.5 F (36.9 C) (Oral)   Resp 18   Ht 5\' 9"  (1.753 m)   Wt 160 lb (72.6 kg)   SpO2 98%   BMI 23.63 kg/m Nurse's notes and vital signs reviewed General- well-nourished, well-developed young adult male in no acute distress.  Patient is accompanied by his mother at today's visit ENT-TMs light pink bilaterally, nares with moderate edema of the nasal turbinates with clear nasal discharge, patient with mild posterior pharynx erythema Neck-supple, no lymphadenopathy Cardiovascular-regular rate and rhythm Lungs-clear to auscultation bilaterally, no wheezing, mild decrease in air movement ABD-  soft and nontender Back- no CVA tenderness Skin- mid acanthosis nigricans on the neck area      Assessment & Plan:  1. Moderate persistent asthma, unspecified whether complicated Patient with moderate persistent asthma and that he continues to have issues with a chronic cough which may be an asthma variant versus his allergic rhinitis.  Patient denies any recent issues with wheezing, chest tightness or  shortness of breath.  Patient will continue his current medications and will have labs in follow-up of medication use - Comprehensive metabolic panel - Lipid panel - Hemoglobin A1c  2. Chronic cough Patient has been evaluated by allergist who believes that patient's chronic cough is related to his allergic rhinitis.  Patient is now receiving allergy shots and per mother, they were told that if allergy shots are effective it could still take up to 3 months before the cough goes away if related to his allergies.  Patient has had no worsening of cough and cough is still nonproductive  3. Non-seasonal allergic rhinitis, unspecified trigger Patient with chronic nonseasonal allergic rhinitis for which he is now receiving allergy shots per patient and his mother.  Patient continues to have chronic issues with nasal congestion and cough - Comprehensive metabolic panel  4. Encounter for long-term (current) use of medications Patient will have CMP, lipid panel and hemoglobin A1c in follow-up of long-term use of medications for his treatment of asthma, allergic rhinitis and ADHD - Comprehensive metabolic panel - Lipid panel - Hemoglobin A1c  5. Screening for lipid disorders Patient will have lipid panel as a screening for lipid disorders - Lipid panel  6. Family history of diabetes mellitus Patient with family history of mother with diabetes and patient has skin changes on exam, acanthosis nigricans which can be associated with prediabetes/diabetes.  Patient will have hemoglobin A1c at today's visit - Hemoglobin A1c  An After Visit Summary was printed and given to the patient. Allergies as of 11/29/2018      Reactions   Advair Diskus [fluticasone-salmeterol] Rash   Chocolate Hives, Itching, Rash   Penicillins Rash      Medication List       Accurate as of November 29, 2018  2:22 PM. Always use your most recent med list.        albuterol (2.5 MG/3ML) 0.083% nebulizer solution Commonly  known as:  PROVENTIL Take 2.5 mg by nebulization every 6 (six) hours as needed. For shortness of breath   albuterol 108 (90 Base) MCG/ACT inhaler Commonly known as:  PROVENTIL HFA;VENTOLIN HFA Inhale 2 puffs into the lungs every 6 (six) hours as needed. For wheeze or shortness of breath   azithromycin 250 MG tablet Commonly known as:  ZITHROMAX 2 pills on the first day then one pill daily for 4 days   azithromycin 250 MG tablet Commonly known as:  ZITHROMAX Take 2 pills on the first day then 1 pill daily for the next 4 days   benzonatate 100 MG capsule Commonly known as:  TESSALON Take 1 capsule (100 mg total) by mouth 2 (two) times daily as needed for cough.   cetirizine 10 MG tablet Commonly known as:  ZYRTEC Take 1 tablet (10 mg total) by mouth at bedtime.   methocarbamol 500 MG tablet Commonly known as:  ROBAXIN Take 1 tablet (500 mg total) by mouth 2 (two) times daily.   methylphenidate 27 MG CR tablet Commonly known as:  CONCERTA Take 27 mg by mouth every morning.   montelukast 10 MG tablet Commonly  known as:  SINGULAIR Take 1 tablet (10 mg total) by mouth at bedtime.   multivitamin animal shapes (with Ca/FA) with C & FA chewable tablet Chew 1 tablet by mouth daily.   predniSONE 20 MG tablet Commonly known as:  DELTASONE Take 1 tablet (20 mg total) by mouth daily with breakfast.   predniSONE 20 MG tablet Commonly known as:  DELTASONE 2 pills once daily for 2 days then 1 pill daily for 2 days then 1/2 pill daily for 4 days; take after eating     .Return in about 4 months (around 03/31/2019) for asthma-4 months and as needed.

## 2018-11-30 LAB — LIPID PANEL
Chol/HDL Ratio: 4 ratio (ref 0.0–5.0)
Cholesterol, Total: 149 mg/dL (ref 100–169)
HDL: 37 mg/dL — ABNORMAL LOW
LDL Calculated: 97 mg/dL (ref 0–109)
Triglycerides: 74 mg/dL (ref 0–89)
VLDL Cholesterol Cal: 15 mg/dL (ref 5–40)

## 2018-11-30 LAB — COMPREHENSIVE METABOLIC PANEL WITH GFR
ALT: 17 IU/L (ref 0–44)
AST: 18 IU/L (ref 0–40)
Albumin/Globulin Ratio: 2.4 — ABNORMAL HIGH (ref 1.2–2.2)
Albumin: 4.6 g/dL (ref 3.5–5.5)
Alkaline Phosphatase: 56 IU/L (ref 39–117)
BUN/Creatinine Ratio: 15 (ref 9–20)
BUN: 14 mg/dL (ref 6–20)
Bilirubin Total: 0.5 mg/dL (ref 0.0–1.2)
CO2: 23 mmol/L (ref 20–29)
Calcium: 9.4 mg/dL (ref 8.7–10.2)
Chloride: 104 mmol/L (ref 96–106)
Creatinine, Ser: 0.94 mg/dL (ref 0.76–1.27)
GFR calc Af Amer: 135 mL/min/1.73
GFR calc non Af Amer: 117 mL/min/1.73
Globulin, Total: 1.9 g/dL (ref 1.5–4.5)
Glucose: 83 mg/dL (ref 65–99)
Potassium: 4.5 mmol/L (ref 3.5–5.2)
Sodium: 141 mmol/L (ref 134–144)
Total Protein: 6.5 g/dL (ref 6.0–8.5)

## 2018-11-30 LAB — HEMOGLOBIN A1C
Est. average glucose Bld gHb Est-mCnc: 105 mg/dL
Hgb A1c MFr Bld: 5.3 % (ref 4.8–5.6)

## 2018-12-02 ENCOUNTER — Telehealth: Payer: Self-pay | Admitting: *Deleted

## 2018-12-02 NOTE — Telephone Encounter (Signed)
Medical Assistant left message on patient's home and cell voicemail. Voicemail states to give [place] call back [facility] [place] [facility] at [phone]. Patient is aware of labs being normal and needing to take omega 3 OTC to keep cholesterol in normal range.

## 2018-12-02 NOTE — Telephone Encounter (Signed)
-----   Message from Cain Saupeammie Fulp, MD sent at 12/01/2018 11:47 PM EST ----- Notify patient's mother that his CMET was normal. Hgb A1c was normal at 5.3 (blood sugar levels have been normal over the past 90 days). Lipid panel was normal with exception of HDL being at little low at 37 with goal of 40 or higher. Patient can increase foods rich in omega 3 fatty acids in his diet or take an omega 3 supplement and recheck lipids in 6-12 months.

## 2019-02-23 ENCOUNTER — Telehealth: Payer: Self-pay | Admitting: Family Medicine

## 2019-02-23 NOTE — Telephone Encounter (Signed)
Please construct a letter on patients behalf. MA will place at the front desk for pickup and notify the mother when ready.

## 2019-02-23 NOTE — Telephone Encounter (Signed)
pts mother called in stating pt got assigned jury duty and hes not able to preform do to his disorder of autism and aspergers would need a letter from Dr. Donnella Sham he wont be able to attend his assigned jury duty please follow up

## 2019-02-25 ENCOUNTER — Encounter: Payer: Self-pay | Admitting: Family Medicine

## 2019-02-25 NOTE — Progress Notes (Signed)
Patient ID: Jesse Rhodes, male   DOB: 29-Dec-2000, 18 y.o.   MRN: [id number]   Call received from patient's mother that patient requires a note to be excused from jury duty due to his medical conditions.

## 2019-02-25 NOTE — Telephone Encounter (Signed)
Letter was written and mother will be notified to pick up

## 2019-03-28 ENCOUNTER — Ambulatory Visit: Payer: Medicaid Other | Attending: Family Medicine | Admitting: Family Medicine

## 2019-03-28 ENCOUNTER — Other Ambulatory Visit: Payer: Self-pay

## 2019-03-28 ENCOUNTER — Encounter: Payer: Self-pay | Admitting: Family Medicine

## 2019-03-28 DIAGNOSIS — R053 Chronic cough: Secondary | ICD-10-CM

## 2019-03-28 DIAGNOSIS — J3089 Other allergic rhinitis: Secondary | ICD-10-CM | POA: Diagnosis not present

## 2019-03-28 DIAGNOSIS — J4540 Moderate persistent asthma, uncomplicated: Secondary | ICD-10-CM | POA: Diagnosis not present

## 2019-03-28 DIAGNOSIS — R05 Cough: Secondary | ICD-10-CM

## 2019-03-28 MED ORDER — BENZONATATE 100 MG PO CAPS: 100.0000 mg | ORAL_CAPSULE | Freq: Two times a day (BID) | ORAL | 2 refills | Status: DC | PRN

## 2019-03-28 NOTE — Progress Notes (Signed)
Virtual Visit via Telephone Note  I connected with Jesse Rhodes on 03/28/19 at 11:10 AM EDT by telephone and verified that I am speaking with the correct person using two identifiers. Patient was on speaker phone along with his mother Jesse Rhodes as patient with an educational/social impairment.    I discussed the limitations, risks, security and privacy concerns of performing an evaluation and management service by telephone and the availability of in person appointments. I also discussed with the patient that there may be a patient responsible charge related to this service. The patient/mother expressed understanding and agreed to proceed.  Patient/parent location: Home Provider location: Office  Call was initiated then transferred to me by Guillermina City, CMA  History of Present Illness:      18 yo male with history of moderate persistent asthma and perennial allergic rhinitis who is also followed by an allergist who is being "seen" in follow-up of his asthma along with a recurrent cough.  Patient reports that he feels well at today's visit.  He denies any significant problems.  Patient has had no wheezing, and no chest tightness.  Patient does have nasal congestion/stuffy nose and postnasal drainage.  Patient continues to have an occasional cough but both patient and mother feels that his cough is greatly improved.  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  Both patient and mom feel that since patient is no longer working and is at home all day, his cough has greatly improved.  Mother would like to have a refill of Tessalon Perles which was provided in the past as she states that this did help when patient would have recurrent cough.      Patient continues to take all of his medications for asthma and allergic rhinitis.  Patient's nasal congestion and postnasal drainage are controlled with his asthma and  allergy medicines.  Patient denies any ear pain, no sore throat, no fever or chills.  Patient's nasal drainage has been clear.  Sneezing has been minimal.  No headache or dizziness.  Past Medical History:  Diagnosis Date  . ADHD (attention deficit hyperactivity disorder)   . Asthma   . Autism   . Constipation    Past Surgical History:  Procedure Laterality Date  . ADENOIDECTOMY     Family History  Problem Relation Age of Onset  . Diabetes Mother   . Hypertension Mother    Social History   Tobacco Use  . Smoking status: Never Smoker  . Smokeless tobacco: Never Used  Substance Use Topics  . Alcohol use: Never    Frequency: Never  . Drug use: Never   Allergies  Allergen Reactions  . Advair Diskus [Fluticasone-Salmeterol] Rash  . Chocolate Hives, Itching and Rash  . Penicillins Rash   Review of Systems  Constitutional: Negative for chills and fever.  HENT: Positive for congestion. Negative for ear pain and sore throat.        Clear nasal drainage and postnasal drainage consistent with allergy symptoms  Eyes: Negative for blurred vision, double vision, discharge and redness.  Respiratory: Positive for cough. Negative for shortness of breath and wheezing.        Mild non-productive cough  Cardiovascular: Negative for chest pain and palpitations.  Gastrointestinal: Negative for abdominal pain, nausea and vomiting.  Genitourinary: Negative for dysuria and frequency.  Musculoskeletal: Negative for back pain, joint pain and myalgias.  Neurological: Negative for dizziness and headaches.  Endo/Heme/Allergies: Positive for environmental allergies. Negative for polydipsia. Does not bruise/bleed easily.      Observations/Objective: No vital signs or physical exam done at today's visit as visit was conducted via telephone   Assessment and Plan: 1. 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  There is been some concern that he may be having increased cough/asthma symptoms due to environmental exposures at his workplace.  Mother states that when the COVID quarantine is over that she will help patient find a different job.  2. Non-seasonal allergic rhinitis, unspecified trigger Patient and mother feel that his allergic rhinitis symptoms are controlled with his current use of Zyrtec and Singulair.  3. Chronic cough Patient with chronic cough but both patient and mother feel that this is improved on his current medications and since he is no longer working and is staying at home.  Mother did request prescription for Jerilynn Somessalon Perles and mother made aware that this should not be a daily medication and that any increase in cough should be reported to this office or to patient's allergist. - benzonatate (TESSALON) 100 MG capsule; Take 1 capsule (100 mg total) by mouth 2 (two) times daily as needed for cough.  Dispense: 20 capsule; Refill: 2   Follow Up Instructions:Return in about 4 months (around 07/28/2019) for asthma-f/u in 4-6 months.    I discussed the assessment and treatment plan with the patient. The patient was provided an opportunity to ask questions and all were answered. The patient agreed with the plan and demonstrated an understanding of the instructions.   The patient was advised to call back or seek an in-person evaluation if the symptoms worsen or if the condition fails to improve as anticipated.  I provided 9 minutes of non-face-to-face time during this encounter.   Cain Saupeammie Shakenya Stoneberg, MD

## 2019-03-28 NOTE — Progress Notes (Signed)
4 month f/u for Asthma   Per patient his Asthma is doing good  No medication refills  No pain

## 2020-02-03 ENCOUNTER — Other Ambulatory Visit: Payer: Self-pay

## 2020-02-03 ENCOUNTER — Ambulatory Visit: Payer: Medicaid Other | Attending: Family Medicine | Admitting: Family Medicine

## 2020-02-03 ENCOUNTER — Other Ambulatory Visit: Payer: Self-pay | Admitting: Family Medicine

## 2020-02-03 ENCOUNTER — Encounter: Payer: Self-pay | Admitting: Family Medicine

## 2020-02-03 VITALS — BP 128/78 | HR 95 | Temp 98.5°F | Resp 18 | Ht 68.0 in | Wt 150.0 lb

## 2020-02-03 DIAGNOSIS — J4521 Mild intermittent asthma with (acute) exacerbation: Secondary | ICD-10-CM

## 2020-02-03 DIAGNOSIS — R21 Rash and other nonspecific skin eruption: Secondary | ICD-10-CM

## 2020-02-03 DIAGNOSIS — Z0000 Encounter for general adult medical examination without abnormal findings: Secondary | ICD-10-CM

## 2020-02-03 MED ORDER — TRIAMCINOLONE ACETONIDE 0.1 % EX CREA: 1.0000 "application " | TOPICAL_CREAM | Freq: Two times a day (BID) | CUTANEOUS | 3 refills | Status: DC

## 2020-02-03 MED ORDER — ALBUTEROL SULFATE HFA 108 (90 BASE) MCG/ACT IN AERS: 2.0000 | INHALATION_SPRAY | Freq: Four times a day (QID) | RESPIRATORY_TRACT | 11 refills | Status: DC | PRN

## 2020-02-03 NOTE — Progress Notes (Signed)
Patient ID: Jesse Rhodes, male   DOB: Nov 27, 2001, 19 y.o.   MRN: [id number]   Patient here for well exam but also needs refill of his albuterol inhaler and also has had a dry rash on the back of the hands over the knuckles as well as acne on his back. Derm referral also placed.

## 2020-02-03 NOTE — Progress Notes (Signed)
Subjective:  Patient ID: Jesse Rhodes, male    DOB: [birth year]  Age: 19 y.o. MRN: [id number]  CC: Annual Exam   Jesse Rhodes, 19 yo male, who presents for annual well exam.  Medical history is significant for moderate persistent asthma, allergic rhinitis, high functioning autism and ADHD.  Patient is accompanied by his mother today's visit.  He reports that he has been well.  His only concern is that he did have a recent, dry and slightly itchy rash on the back of his hands, mostly on the left side.  He reports that his asthma has been well controlled and he is having no issues with cough or wheeze at this time.  Per patient and mother, patient continues to have a good appetite, patient gets adequate sleep of 8 to 10 hours per night.  Exercise has been limited due to the current Covid pandemic.  Overall patient is doing well at this time.  Past Medical History:  Diagnosis Date  . ADHD (attention deficit hyperactivity disorder)   . Asthma   . Autism   . Constipation     Past Surgical History:  Procedure Laterality Date  . ADENOIDECTOMY      Family History  Problem Relation Age of Onset  . Diabetes Mother   . Hypertension Mother     Social History   Tobacco Use  . Smoking status: Never Smoker  . Smokeless tobacco: Never Used  Substance Use Topics  . Alcohol use: Never    ROS Review of Systems  Constitutional: Negative for chills, fatigue and fever.  HENT: Positive for congestion (occasional). Negative for sore throat and trouble swallowing.   Eyes: Negative for photophobia and visual disturbance.       Wears glasses  Respiratory: Negative for cough, shortness of breath and wheezing.   Cardiovascular: Negative for chest pain, palpitations and leg swelling.  Gastrointestinal: Negative for abdominal pain, blood in stool, constipation, diarrhea and nausea.  Endocrine: Negative for cold intolerance, heat intolerance, polydipsia, polyphagia and polyuria.  Genitourinary:  Negative for dysuria and frequency.  Musculoskeletal: Negative for arthralgias, back pain and gait problem.  Skin: Positive for rash. Negative for wound.       Dry skin on the back of the hands- left greater than right- now improved; acne on back-recurrent  Neurological: Negative for dizziness, numbness and headaches.  Hematological: Negative for adenopathy. Does not bruise/bleed easily.  Psychiatric/Behavioral: Negative for self-injury and suicidal ideas. The patient is not nervous/anxious.     Objective:   Today's Vitals: BP 128/78 (BP Location: Right Arm, Patient Position: Sitting, Cuff Size: Normal)   Pulse 95   Temp 98.5 F (36.9 C) (Oral)   Resp 18   Ht 5\' 8"  (1.727 m)   Wt 150 lb (68 kg)   SpO2 98%   BMI 22.81 kg/m   Physical Exam Vitals and nursing note reviewed.  Constitutional:      Appearance: Normal appearance.     Comments: WNWD young adult male in NAD wearing mask as per office COVID-19 precautions; accompanied by his mother at today's visit  Neck:     Vascular: No carotid bruit.     Comments: Fulness in mid-neck at area of thyroid but no palpable thyromegaly Cardiovascular:     Rate and Rhythm: Normal rate and regular rhythm.  Pulmonary:     Effort: Pulmonary effort is normal.     Breath sounds: Normal breath sounds. No wheezing.  Abdominal:     Palpations: Abdomen is  soft.     Tenderness: There is no abdominal tenderness. There is no right CVA tenderness, left CVA tenderness, guarding or rebound.  Musculoskeletal:        General: No tenderness, deformity or signs of injury.     Cervical back: Normal range of motion and neck supple.     Right lower leg: No edema.     Left lower leg: No edema.     Comments: Laxity of knee caps; no reproducible joint pain  Lymphadenopathy:     Cervical: No cervical adenopathy.  Skin:    General: Skin is warm and dry.     Comments: Mild inflamed papules on the upper back and some hyperpigmented skin changes; vertical  hyperpigmented line on the left thumbnail- nail bed appears normal; presence of dry, slightly hypertrophic skin changes dorsum of hand between fingers and dry, slightly hyperpigmented skin on left knuckles  Neurological:     Mental Status: He is alert.     Assessment & Plan:  1. Well adult exam 19 year old male with high functioning autism, ADHD, asthma and allergic rhinitis who was seen for annual well exam.  Patient with no significant issues at today's visit.  He is to continue current diet, home exercise program encouraged.  Immunizations are up-to-date and no current health maintenance issues.  Anticipatory guidance educational material for patient's age group given as part of his after visit summary.  As patient with asthma and allergic rhinitis, discussed with patient and mother that his rash is likely eczema related.  Discussed use of moisturizing creams/lotion such as CeraVe or Eucerin along with avoiding hot water for bathing/handwashing and using room temperature water with handwashing and warm water for bathing with moisturization after bath or shower.  Outpatient Encounter Medications as of 02/03/2020  Medication Sig  . albuterol (PROVENTIL HFA;VENTOLIN HFA) 108 (90 Base) MCG/ACT inhaler Inhale 2 puffs into the lungs every 6 (six) hours as needed. For wheeze or shortness of breath  . albuterol (PROVENTIL) (2.5 MG/3ML) 0.083% nebulizer solution Take 2.5 mg by nebulization every 6 (six) hours as needed. For shortness of breath   . cetirizine (ZYRTEC) 10 MG tablet Take 1 tablet (10 mg total) by mouth at bedtime.  . methylphenidate (CONCERTA) 27 MG CR tablet Take 27 mg by mouth every morning.   . montelukast (SINGULAIR) 10 MG tablet Take 1 tablet (10 mg total) by mouth at bedtime. (Patient not taking: Reported on 02/03/2020)  . Pediatric Multiple Vit-C-FA (MULTIVITAMIN ANIMAL SHAPES, WITH CA/FA,) WITH C & FA CHEW Chew 1 tablet by mouth daily.  . [DISCONTINUED] azithromycin (ZITHROMAX) 250 MG  tablet 2 pills on the first day then one pill daily for 4 days (Patient not taking: Reported on 11/01/2018)  . [DISCONTINUED] azithromycin (ZITHROMAX) 250 MG tablet Take 2 pills on the first day then 1 pill daily for the next 4 days (Patient not taking: Reported on 11/01/2018)  . [DISCONTINUED] benzonatate (TESSALON) 100 MG capsule Take 1 capsule (100 mg total) by mouth 2 (two) times daily as needed for cough.  . [DISCONTINUED] methocarbamol (ROBAXIN) 500 MG tablet Take 1 tablet (500 mg total) by mouth 2 (two) times daily. (Patient not taking: Reported on 09/03/2018)  . [DISCONTINUED] predniSONE (DELTASONE) 20 MG tablet Take 1 tablet (20 mg total) by mouth daily with breakfast. (Patient not taking: Reported on 11/01/2018)  . [DISCONTINUED] predniSONE (DELTASONE) 20 MG tablet 2 pills once daily for 2 days then 1 pill daily for 2 days then 1/2 pill daily for 4  days; take after eating (Patient not taking: Reported on 03/28/2019)   No facility-administered encounter medications on file as of 02/03/2020.    An After Visit Summary was printed and given to the patient.   Follow-up: Return in about 4 months (around 06/02/2020) for ashtma; otherwise as needed.   Antony Blackbird MD

## 2020-02-03 NOTE — Patient Instructions (Signed)
Preventive Care 19-21 Years Old, Male Preventive care refers to lifestyle choices and visits with your health care provider that can promote health and wellness. This includes:  A yearly physical exam. This is also called an annual well check.  Regular dental and eye exams.  Immunizations.  Screening for certain conditions.  Healthy lifestyle choices, such as eating a healthy diet, getting regular exercise, not using drugs or products that contain nicotine and tobacco, and limiting alcohol use. What can I expect for my preventive care visit? Physical exam Your health care provider will check:  Height and weight. These may be used to calculate body mass index (BMI), which is a measurement that tells if you are at a healthy weight.  Heart rate and blood pressure.  Your skin for abnormal spots. Counseling Your health care provider may ask you questions about:  Alcohol, tobacco, and drug use.  Emotional well-being.  Home and relationship well-being.  Sexual activity.  Eating habits.  Work and work Statistician. What immunizations do I need?  Influenza (flu) vaccine  This is recommended every year. Tetanus, diphtheria, and pertussis (Tdap) vaccine  You may need a Td booster every 10 years. Varicella (chickenpox) vaccine  You may need this vaccine if you have not already been vaccinated. Human papillomavirus (HPV) vaccine  If recommended by your health care provider, you may need three doses over 6 months. Measles, mumps, and rubella (MMR) vaccine  You may need at least one dose of MMR. You may also need a second dose. Meningococcal conjugate (MenACWY) vaccine  One dose is recommended if you are 45-76 years old and a Market researcher living in a residence hall, or if you have one of several medical conditions. You may also need additional booster doses. Pneumococcal conjugate (PCV13) vaccine  You may need this if you have certain conditions and were not  previously vaccinated. Pneumococcal polysaccharide (PPSV23) vaccine  You may need one or two doses if you smoke cigarettes or if you have certain conditions. Hepatitis A vaccine  You may need this if you have certain conditions or if you travel or work in places where you may be exposed to hepatitis A. Hepatitis B vaccine  You may need this if you have certain conditions or if you travel or work in places where you may be exposed to hepatitis B. Haemophilus influenzae type b (Hib) vaccine  You may need this if you have certain risk factors. You may receive vaccines as individual doses or as more than one vaccine together in one shot (combination vaccines). Talk with your health care provider about the risks and benefits of combination vaccines. What tests do I need? Blood tests  Lipid and cholesterol levels. These may be checked every 5 years starting at age 17.  Hepatitis C test.  Hepatitis B test. Screening   Diabetes screening. This is done by checking your blood sugar (glucose) after you have not eaten for a while (fasting).  Sexually transmitted disease (STD) testing. Talk with your health care provider about your test results, treatment options, and if necessary, the need for more tests. Follow these instructions at home: Eating and drinking   Eat a diet that includes fresh fruits and vegetables, whole grains, lean protein, and low-fat dairy products.  Take vitamin and mineral supplements as recommended by your health care provider.  Do not drink alcohol if your health care provider tells you not to drink.  If you drink alcohol: ? Limit how much you have to 0-2  drinks a day. ? Be aware of how much alcohol is in your drink. In the [place] one drink equals one 12 oz bottle of beer (355 mL), one 5 oz glass of wine (148 mL), or one 1 oz glass of hard liquor (44 mL). Lifestyle  Take daily care of your teeth and gums.  Stay active. Exercise for at least 30 minutes on 5 or  more days each week.  Do not use any products that contain nicotine or tobacco, such as cigarettes, e-cigarettes, and chewing tobacco. If you need help quitting, ask your health care provider.  If you are sexually active, practice safe sex. Use a condom or other form of protection to prevent STIs (sexually transmitted infections). What's next?  Go to your health care provider once a year for a well check visit.  Ask your health care provider how often you should have your eyes and teeth checked.  Stay up to date on all vaccines. This information is not intended to replace advice given to you by your health care provider. Make sure you discuss any questions you have with your health care provider. Document Revised: 11/18/2018 Document Reviewed: 11/18/2018 Elsevier Patient Education  2020 [employer].

## 2020-02-03 NOTE — Progress Notes (Signed)
Patient complains of dry skin on the left hand in between the knuckles.  Patient has bumps on his back as well.

## 2020-06-08 ENCOUNTER — Ambulatory Visit: Payer: Medicaid Other | Admitting: Family Medicine

## 2020-08-08 ENCOUNTER — Ambulatory Visit: Payer: Medicaid Other | Admitting: Family Medicine

## 2020-09-13 ENCOUNTER — Encounter: Payer: Self-pay | Admitting: Family Medicine

## 2020-09-13 ENCOUNTER — Encounter (HOSPITAL_BASED_OUTPATIENT_CLINIC_OR_DEPARTMENT_OTHER): Payer: Medicaid Other | Admitting: Family Medicine

## 2020-09-13 ENCOUNTER — Other Ambulatory Visit: Payer: Self-pay

## 2020-09-13 ENCOUNTER — Ambulatory Visit: Payer: Medicaid Other | Attending: Family Medicine | Admitting: Family Medicine

## 2020-09-13 DIAGNOSIS — J4541 Moderate persistent asthma with (acute) exacerbation: Secondary | ICD-10-CM

## 2020-09-13 DIAGNOSIS — J209 Acute bronchitis, unspecified: Secondary | ICD-10-CM

## 2020-09-13 MED ORDER — PREDNISONE 20 MG PO TABS: ORAL_TABLET | ORAL | 0 refills | Status: DC

## 2020-09-13 MED ORDER — AZITHROMYCIN 250 MG PO TABS: ORAL_TABLET | ORAL | 0 refills | Status: DC

## 2020-09-13 NOTE — Progress Notes (Signed)
  Virtual Visit via Telephone Note  I connected with Brittain Farquhar  on 09/13/20 at  3:10 PM EDT by telephone and verified that I am speaking with the correct person using two identifiers.   I discussed the limitations, risks, security and privacy concerns of performing an evaluation and management service by telephone and the availability of in person appointments. I also discussed with the patient that there may be a patient responsible charge related to this service. The patient expressed understanding and agreed to proceed.  Patient Location: Home Provider Location: [facility] Others participating in call: Patient's mother   History of Present Illness:        19 year old male with moderate persistent asthma and allergic rhinitis who reports worsening, harsh cough over the past 2 to 3 days along with sensation of chest tightness. He does not feel that he has been coughing up anything. He denies fever or chills. He has had some increase in feeling tired/fatigue. He denies sore throat, no headache or dizziness. Cough has interfered with sleep. Mom agrees that patient with harsh sounding cough.  Past Medical History:  Diagnosis Date  . ADHD (attention deficit hyperactivity disorder)   . Asthma   . Autism   . Constipation     Past Surgical History:  Procedure Laterality Date  . ADENOIDECTOMY      Family History  Problem Relation Age of Onset  . Diabetes Mother   . Hypertension Mother     Social History   Tobacco Use  . Smoking status: Never Smoker  . Smokeless tobacco: Never Used  Vaping Use  . Vaping Use: Never used  Substance Use Topics  . Alcohol use: Never  . Drug use: Never     Allergies  Allergen Reactions  . Advair Diskus [Fluticasone-Salmeterol] Rash  . Chocolate Hives, Itching and Rash  . Penicillins Rash       Observations/Objective: No vital signs or physical exam conducted as visit was done via telephone Patient could be heard having episodes of coughing  and had slight nasal quality/hoarseness to speaking voice.  Assessment and Plan: 1. Asthma in adult, moderate persistent, with acute exacerbation 2. Acute bronchitis, unspecified organism Patient with known asthma and encouraged use of albuterol inhaler or nebulizer every 6 hours x3 days then as needed. Continue use of home medications such as Robitussin-DM as needed for cough and chest congestion. Prescription azithromycin Z-Pak in case of bronchitis/atypical pneumonia. Prescription for prednisone for chest tightness/wheezing. If any acute worsening of symptoms or any concerns, patient should be evaluated at the emergency department if symptoms are not improving, schedule follow-up appointment next week. - azithromycin (ZITHROMAX) 250 MG tablet; 2 pills today then one pill daily for 4 days  Dispense: 6 tablet; Refill: 0 - predniSONE (DELTASONE) 20 MG tablet; 2 pill once per day for 5 days; take after eating  Dispense: 10 tablet; Refill: 0  Follow Up Instructions:Return for Asthma exacerbation-next week if not better; ED if acute worsening.    I discussed the assessment and treatment plan with the patient. The patient was provided an opportunity to ask questions and all were answered. The patient agreed with the plan and demonstrated an understanding of the instructions.   The patient was advised to call back or seek an in-person evaluation if the symptoms worsen or if the condition fails to improve as anticipated.  I provided 10 minutes of non-face-to-face time during this encounter.   Cain Saupe, MD

## 2020-09-13 NOTE — Progress Notes (Signed)
Coughing, chest tightness

## 2021-04-12 ENCOUNTER — Ambulatory Visit: Payer: Medicaid Other | Admitting: Internal Medicine

## 2021-12-01 ENCOUNTER — Other Ambulatory Visit: Payer: Self-pay

## 2021-12-01 ENCOUNTER — Emergency Department (HOSPITAL_COMMUNITY): Payer: Medicaid Other

## 2021-12-01 ENCOUNTER — Encounter (HOSPITAL_COMMUNITY): Payer: Self-pay

## 2021-12-01 ENCOUNTER — Emergency Department (HOSPITAL_COMMUNITY)
Admission: EM | Admit: 2021-12-01 | Discharge: 2021-12-01 | Disposition: A | Discharge status: Home / Self Care | Payer: Medicaid Other | Attending: Emergency Medicine | Admitting: Emergency Medicine

## 2021-12-01 DIAGNOSIS — F840 Autistic disorder: Secondary | ICD-10-CM | POA: Diagnosis not present

## 2021-12-01 DIAGNOSIS — J4521 Mild intermittent asthma with (acute) exacerbation: Secondary | ICD-10-CM

## 2021-12-01 DIAGNOSIS — J4540 Moderate persistent asthma, uncomplicated: Secondary | ICD-10-CM | POA: Insufficient documentation

## 2021-12-01 DIAGNOSIS — J189 Pneumonia, unspecified organism: Secondary | ICD-10-CM

## 2021-12-01 DIAGNOSIS — R059 Cough, unspecified: Secondary | ICD-10-CM | POA: Diagnosis present

## 2021-12-01 DIAGNOSIS — U071 COVID-19: Secondary | ICD-10-CM | POA: Diagnosis not present

## 2021-12-01 DIAGNOSIS — J181 Lobar pneumonia, unspecified organism: Secondary | ICD-10-CM | POA: Insufficient documentation

## 2021-12-01 LAB — RESP PANEL BY RT-PCR (FLU A&B, COVID) ARPGX2
Influenza A by PCR: NEGATIVE
Influenza B by PCR: NEGATIVE
SARS Coronavirus 2 by RT PCR: POSITIVE — AB

## 2021-12-01 MED ORDER — ALBUTEROL SULFATE HFA 108 (90 BASE) MCG/ACT IN AERS: 2.0000 | INHALATION_SPRAY | Freq: Four times a day (QID) | RESPIRATORY_TRACT | 1 refills | Status: DC | PRN

## 2021-12-01 MED ORDER — DOXYCYCLINE HYCLATE 100 MG PO CAPS: 100.0000 mg | ORAL_CAPSULE | Freq: Two times a day (BID) | ORAL | 0 refills | Status: DC

## 2021-12-01 MED ORDER — IPRATROPIUM-ALBUTEROL 0.5-2.5 (3) MG/3ML IN SOLN: 3.0000 mL | Freq: Four times a day (QID) | RESPIRATORY_TRACT | 0 refills | Status: DC | PRN

## 2021-12-01 NOTE — ED Provider Notes (Signed)
[facility] EMERGENCY DEPARTMENT Provider Note   CSN: [id number] Arrival date & time: 12/01/21  [id number]     History Chief Complaint  Patient presents with   Cough   Chest Pain    Jesse Rhodes is a 20 y.o. male history of asthma and autism presents to the emergency department with mother for evaluation of cough and cold symptoms for the past week.  Mom recently diagnosed with COVID in the past few weeks.  Patient reports he has been using his nebulizer often and is finding relief.  He has been taking Mucinex with mild relief.  He reports a cough, chest pain with cough, nasal congestion, occasional posttussive emesis.  Denies any shortness of breath, runny nose, sore throat, nausea, abdominal pain, fever.  Denies any surgical history.  Daily medication includes Flovent.  Allergic to penicillin.  Denies any tobacco, EtOH, or drug use.   Cough Associated symptoms: chest pain   Associated symptoms: no chills, no ear pain, no eye discharge, no fever, no myalgias, no rash, no rhinorrhea, no shortness of breath and no sore throat   Chest Pain Associated symptoms: cough and vomiting (post-tussive)   Associated symptoms: no abdominal pain, no back pain, no dysphagia, no fever, no nausea, no palpitations, no shortness of breath and no weakness       Past Medical History:  Diagnosis Date   ADHD (attention deficit hyperactivity disorder)    Asthma    Autism    Constipation     Patient Active Problem List   Diagnosis Date Noted   Moderate persistent asthma 11/29/2018   Chronic cough 11/29/2018   Non-seasonal allergic rhinitis 11/29/2018   Family history of diabetes mellitus 11/29/2018    Past Surgical History:  Procedure Laterality Date   ADENOIDECTOMY         Family History  Problem Relation Age of Onset   Diabetes Mother    Hypertension Mother     Social History   Tobacco Use   Smoking status: Never   Smokeless tobacco: Never  Vaping Use   Vaping  Use: Never used  Substance Use Topics   Alcohol use: Never   Drug use: Never    Home Medications Prior to Admission medications   Medication Sig Start Date End Date Taking? Authorizing Provider  doxycycline (VIBRAMYCIN) 100 MG capsule Take 1 capsule (100 mg total) by mouth 2 (two) times daily. 12/01/21  Yes Sherrell Puller, PA-C  ipratropium-albuterol (DUONEB) 0.5-2.5 (3) MG/3ML SOLN Take 3 mLs by nebulization every 6 (six) hours as needed. 12/01/21  Yes Sherrell Puller, PA-C  albuterol (PROVENTIL) (2.5 MG/3ML) 0.083% nebulizer solution Take 2.5 mg by nebulization every 6 (six) hours as needed. For shortness of breath     [provider]  albuterol (VENTOLIN HFA) 108 (90 Base) MCG/ACT inhaler Inhale 2 puffs into the lungs every 6 (six) hours as needed. For wheeze or shortness of breath 12/01/21   Sherrell Puller, PA-C  azithromycin [facility]) 250 MG tablet 2 pills today then one pill daily for 4 days 09/13/20   Fulp, Cammie, MD  cetirizine (ZYRTEC) 10 MG tablet Take 1 tablet (10 mg total) by mouth at bedtime. 11/01/18   Fulp, Cammie, MD  methylphenidate (CONCERTA) 27 MG CR tablet Take 27 mg by mouth every morning.     [provider]  montelukast (SINGULAIR) 10 MG tablet Take 1 tablet (10 mg total) by mouth at bedtime. Patient not taking: Reported on 02/03/2020 09/03/18   Antony Blackbird, MD  Pediatric Multiple Vit-C-FA (MULTIVITAMIN ANIMAL SHAPES, WITH CA/FA,) WITH C & FA CHEW Chew 1 tablet by mouth daily.    [provider]  predniSONE (DELTASONE) 20 MG tablet 2 pill once per day for 5 days; take after eating 09/13/20   Fulp, Cammie, MD  triamcinolone cream (KENALOG) 0.1 % Apply 1 application topically 2 (two) times daily. X 7-10 days as needed for rash 02/03/20   Cain Saupe, MD    Allergies    Advair diskus [fluticasone-salmeterol], Chocolate, and Penicillins  Review of Systems   Review of Systems  Constitutional:  Negative for chills and fever.  HENT:  Positive for  congestion. Negative for drooling, ear pain, rhinorrhea, sore throat and trouble swallowing.   Eyes:  Negative for photophobia, discharge and visual disturbance.  Respiratory:  Positive for cough. Negative for shortness of breath.   Cardiovascular:  Positive for chest pain. Negative for palpitations.  Gastrointestinal:  Positive for vomiting (post-tussive). Negative for abdominal pain, diarrhea and nausea.  Genitourinary:  Negative for dysuria and hematuria.  Musculoskeletal:  Negative for arthralgias, back pain, joint swelling and myalgias.  Skin:  Negative for color change and rash.  Neurological:  Negative for syncope and weakness.   Physical Exam Updated Vital Signs BP (!) 136/106 (BP Location: Right Arm)    Pulse 95    Temp 99.5 F (37.5 C) (Oral)    SpO2 100%   Physical Exam Vitals and nursing note reviewed.  Constitutional:      General: He is not in acute distress.    Appearance: He is not toxic-appearing.  HENT:     Head: Normocephalic and atraumatic.     Right Ear: Tympanic membrane, ear canal and external ear normal.     Left Ear: Tympanic membrane, ear canal and external ear normal.     Nose:     Comments: Bilateral nasal turbinate edema and erythema with scant clear nasal discharge.    Mouth/Throat:     Mouth: Mucous membranes are moist.     Comments: No pharyngeal erythema, exudate, or edema noted.  Uvula midline.  Airway patent.  Moist mucous membranes. Eyes:     General: No scleral icterus.    Conjunctiva/sclera: Conjunctivae normal.  Cardiovascular:     Rate and Rhythm: Normal rate.     Heart sounds: Normal heart sounds.  Pulmonary:     Effort: Pulmonary effort is normal. No respiratory distress.     Comments: Clear to auscultation bilaterally.  No respiratory distress, tripoding, accessory muscle use, nasal flaring, or cyanosis present.  Satting 100% on room air. Abdominal:     General: Abdomen is flat.     Palpations: Abdomen is soft.  Musculoskeletal:         General: No deformity.     Cervical back: Normal range of motion.  Skin:    Findings: No rash.  Neurological:     General: No focal deficit present.     Mental Status: He is alert.    ED Results / Procedures / Treatments   Labs (all labs ordered are listed, but only abnormal results are displayed) Labs Reviewed  RESP PANEL BY RT-PCR (FLU A&B, COVID) ARPGX2 - Abnormal; Notable for the following components:      Result Value   SARS Coronavirus 2 by RT PCR POSITIVE (*)    All other components within normal limits    EKG None  Radiology DG Chest 2 View  Result Date: 12/01/2021 CLINICAL DATA:  Chest pain, cough x1 week  EXAM: CHEST - 2 VIEW COMPARISON:  01/13/2011 FINDINGS: Mild patchy/nodular right upper lobe opacity, suspicious for pneumonia. Left lung is clear. No pleural effusion or pneumothorax. The heart is normal in size. Visualized osseous structures are within normal limits. IMPRESSION: Mild right upper lobe pneumonia. Electronically Signed   By: Julian Hy M.D.   On: 12/01/2021 11:14    Procedures Procedures   Medications Ordered in ED Medications - No data to display  ED Course  I have reviewed the triage vital signs and the nursing notes.  Pertinent labs & imaging results that were available during my care of the patient were reviewed by me and considered in my medical decision making (see chart for details).  20 year old male presents emergency department for 1 week of cough and cold symptoms.  Tested positive for COVID, negative for flu.  On exam, patient has bilateral nasal erythema and edema with scant clear nasal discharge.  No pharyngeal erythema.  Lungs are clear to auscultation bilaterally.  Patient's vital signs are stable, he is mildly hypertensive although reports he does not like being in the hospital.  Elevated temperature at 99.5.  Normal heart rate.  Satting 100% percent on room air.  Chest x-ray concerning for right upper lobe pneumonia.   Discussed with my attending physician who recommended treatment with doxycycline given duration of patient's symptoms.  At this time, the patient is safe for discharge as he is satting well on room air and is in no acute distress. Will prescribe patient doxycycline, duo nebs, and a refill on his albuterol inhaler.  Strict return precautions discussed.  Patient agrees to plan.  Patient is stable and being discharged home in good condition.    MDM Rules/Calculators/A&P   Final Clinical Impression(s) / ED Diagnoses Final diagnoses:  COVID  Community acquired pneumonia of right upper lobe of lung    Rx / DC Orders ED Discharge Orders          Ordered    doxycycline (VIBRAMYCIN) 100 MG capsule  2 times daily        12/01/21 1128    albuterol (VENTOLIN HFA) 108 (90 Base) MCG/ACT inhaler  Every 6 hours PRN       Note to Pharmacy: Please dispense 2 if possible; one for work and one for home   12/01/21 1128    ipratropium-albuterol (DUONEB) 0.5-2.5 (3) MG/3ML SOLN  Every 6 hours PRN        12/01/21 1128             Sherrell Puller, [place] 12/01/21 1154    Dorie Rank, MD 12/04/21 1336

## 2021-12-01 NOTE — ED Provider Notes (Signed)
Emergency Medicine Provider Triage Evaluation Note  Jesse Rhodes , a 20 y.o. male  was evaluated in triage.  Pt complains of cough.  States that same has been ongoing for the last week.  He has been taking Mucinex with some relief.  Does have a history of asthma, however states he is not required his albuterol inhaler any more than normal.  Cough productive of tan mucus.  Denies fevers.  Also had some generalized body aches. No known sick contacts. Chest pain only present with coughing, not exertional, does not radiate. No shortness of breath.  Review of Systems  Positive:  Negative: See above  Physical Exam  BP (!) 136/106 (BP Location: Right Arm)    Pulse 95    Temp 99.5 F (37.5 C) (Oral)    SpO2 100%  Gen:   Awake, no distress   Resp:  Normal effort, lungs CTA in all fields MSK:   Moves extremities without difficulty  Other:    Medical Decision Making  Medically screening exam initiated at 9:49 AM.  Appropriate orders placed.  Jesse Rhodes was informed that the remainder of the evaluation will be completed by another provider, this initial triage assessment does not replace that evaluation, and the importance of remaining in the ED until their evaluation is complete.     Jesse Rhodes 12/01/21 [id number]    Jesse Lefevre, MD 12/01/21 1104

## 2021-12-01 NOTE — Discharge Instructions (Addendum)
Seen in today for cough and cold symptoms.  You tested positive for COVID.  Chest x-ray shows possible pneumonia of the right upper lobe.  For this you will be started on doxycycline, antibiotic, that you may need to take as prescribed complete the entirety of the course.  Additionally, I prescribed you duo nebs and an albuterol inhaler refill.  Initial information on COVID included in this discharge paperwork.  Please make sure to wear your mask and quarantine appropriately.  If you have any concern, new or worsening symptoms, please return to the nearest emergency department for reevaluation.

## 2021-12-01 NOTE — ED Triage Notes (Signed)
Pt arrived POV from home c/o a cough for a week and centralized CP when he coughs. Pt states he is coughing a tan colored mucus up.

## 2021-12-01 NOTE — ED Notes (Signed)
AVS with prescriptions provided to and discussed with patient and family member at bedside. Pt verbalizes understanding of discharge instructions and denies any questions or concerns at this time. Pt has ride home. Pt ambulated out of department independently with steady gait.  

## 2022-01-28 ENCOUNTER — Ambulatory Visit: Payer: Medicaid Other | Admitting: Critical Care Medicine

## 2022-04-06 NOTE — Progress Notes (Signed)
? ?New Patient Office Visit ? ?Subjective   ? ?Patient ID: Jesse Rhodes, male    DOB: 2001-07-04  Age: 21 y.o. MRN: [id number] ? ?CC:  ?Chief Complaint  ?Patient presents with  ? Follow-up  ? Cough  ? ? ?HPI ?Jesse Rhodes presents to establish care ?This is a 21 year old male seen to establish primary care he is accompanied by his mother whose name is [name].  Note he scores high on suicidal ideation.  He does not have a plan specifically.  Also history of COVID illness December 25 with right upper lobe pneumonia.  He was treated with antibiotics and supportive care.  Patient does have severe persistent asthma with significant atopy.  He is followed by [facility].  Patient also follows at [facility] for his mental health conditions which include general anxiety and depression.  He still having a mild cough at this time.  He has no other complaints.  He tends to stay at home a lot and play video games.  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  He has no other complaints at this time. ? ?Outpatient Encounter Medications as of 04/07/2022  ?Medication Sig  ? azelastine (ASTELIN) 0.1 % nasal spray Place 2 sprays into both nostrils 2 (two) times daily. Use in each nostril as directed  ? EPINEPHrine 0.3 mg/0.3 mL IJ SOAJ injection See admin instructions.  ? escitalopram (LEXAPRO) 10 MG tablet Take 1 tablet (10 mg total) by mouth daily.  ? Olopatadine HCl 0.2 % SOLN 1 drop into affected eye  ? Spacer/Aero-Holding Chambers (AEROCHAMBER PLUS FLO-VU LARGE) MISC See admin instructions.  ? [DISCONTINUED] albuterol (PROVENTIL) (2.5 MG/3ML) 0.083% nebulizer solution Take 2.5 mg by nebulization every 6 (six) hours as needed. For shortness of breath   ? [DISCONTINUED] albuterol (VENTOLIN HFA) 108 (90 Base) MCG/ACT inhaler Inhale 2 puffs into the lungs every 6 (six) hours as needed. For wheeze or shortness of breath  ? [DISCONTINUED] cetirizine (ZYRTEC) 10 MG tablet Take 1 tablet (10 mg total) by mouth at  bedtime.  ? [DISCONTINUED] fluticasone (FLONASE) 50 MCG/ACT nasal spray 1-2 sprays in each nostril  ? [DISCONTINUED] fluticasone (FLOVENT HFA) 110 MCG/ACT inhaler 2 puffs  ? [DISCONTINUED] ipratropium-albuterol (DUONEB) 0.5-2.5 (3) MG/3ML SOLN Take 3 mLs by nebulization every 6 (six) hours as needed.  ? [DISCONTINUED] levocetirizine (XYZAL) 2.5 MG/5ML solution 5-10 ml in the evening  ? [DISCONTINUED] montelukast (SINGULAIR) 10 MG tablet 1 tablet in the evening  ? albuterol (VENTOLIN HFA) 108 (90 Base) MCG/ACT inhaler Inhale 2 puffs into the lungs every 6 (six) hours as needed. For wheeze or shortness of breath  ? cetirizine (ZYRTEC) 10 MG tablet Take 1 tablet (10 mg total) by mouth at bedtime.  ? fluticasone (FLONASE) 50 MCG/ACT nasal spray 1-2 sprays in each nostril  ? fluticasone (FLOVENT HFA) 110 MCG/ACT inhaler 2 puffs  ? ipratropium-albuterol (DUONEB) 0.5-2.5 (3) MG/3ML SOLN Take 3 mLs by nebulization every 6 (six) hours as needed.  ? montelukast (SINGULAIR) 10 MG tablet 1 tablet in the evening  ? [DISCONTINUED] azithromycin (ZITHROMAX) 250 MG tablet 2 pills today then one pill daily for 4 days (Patient not taking: Reported on 04/07/2022)  ? [DISCONTINUED] doxycycline (VIBRAMYCIN) 100 MG capsule Take 1 capsule (100 mg total) by mouth 2 (two) times daily. (Patient not taking: Reported on 04/07/2022)  ? [DISCONTINUED] methylphenidate (CONCERTA) 27 MG CR tablet Take 27 mg by mouth every morning.  (Patient not taking: Reported on 04/07/2022)  ? [DISCONTINUED]  mometasone (ELOCON) 0.1 % cream 1 application to affected area (Patient not taking: Reported on 04/07/2022)  ? [DISCONTINUED] montelukast (SINGULAIR) 10 MG tablet Take 1 tablet (10 mg total) by mouth at bedtime. (Patient not taking: Reported on 02/03/2020)  ? [DISCONTINUED] Pediatric Multiple Vit-C-FA (MULTIVITAMIN ANIMAL SHAPES, WITH CA/FA,) WITH C & FA CHEW Chew 1 tablet by mouth daily. (Patient not taking: Reported on 04/07/2022)  ? [DISCONTINUED] predniSONE  (DELTASONE) 20 MG tablet 2 pill once per day for 5 days; take after eating (Patient not taking: Reported on 04/07/2022)  ? [DISCONTINUED] triamcinolone cream (KENALOG) 0.1 % Apply 1 application topically 2 (two) times daily. X 7-10 days as needed for rash (Patient not taking: Reported on 04/07/2022)  ? ?No facility-administered encounter medications on file as of 04/07/2022.  ? ? ?Past Medical History:  ?Diagnosis Date  ? ADHD (attention deficit hyperactivity disorder)   ? Allergy   ? Asthma   ? Autism   ? Constipation   ? ? ?Past Surgical History:  ?Procedure Laterality Date  ? ADENOIDECTOMY    ? ? ?Family History  ?Problem Relation Age of Onset  ? Hypertension Mother   ? ? ?Social History  ? ?Socioeconomic History  ? Marital status: Single  ?  Spouse name: Not on file  ? Number of children: Not on file  ? Years of education: Not on file  ? Highest education level: Not on file  ?Occupational History  ? Occupation: Press photographer  ?Tobacco Use  ? Smoking status: Never  ? Smokeless tobacco: Never  ?Vaping Use  ? Vaping Use: Never used  ?Substance and Sexual Activity  ? Alcohol use: Never  ? Drug use: Never  ? Sexual activity: Never  ?Other Topics Concern  ? Not on file  ?Social History Narrative  ? Not on file  ? ?Social Determinants of Health  ? ?Financial Resource Strain: Not on file  ?Food Insecurity: Not on file  ?Transportation Needs: Not on file  ?Physical Activity: Not on file  ?Stress: Not on file  ?Social Connections: Not on file  ?Intimate Partner Violence: Not on file  ? ? ?Review of Systems  ?Constitutional:  Negative for fever.  ?HENT:  Positive for congestion and sinus pain. Negative for ear discharge, ear pain, hearing loss, nosebleeds and sore throat.   ?Eyes: Negative.   ?Respiratory:  Positive for cough, shortness of breath and wheezing.   ?     Cough is dry  ?Cardiovascular:  Negative for chest pain.  ?Gastrointestinal: Negative.   ?Genitourinary: Negative.   ?Musculoskeletal: Negative.   ?Neurological: Negative.    ?Endo/Heme/Allergies:  Positive for environmental allergies. Negative for polydipsia. Does not bruise/bleed easily.  ?Psychiatric/Behavioral:  Positive for depression. The patient is nervous/anxious.   ? ?  ? ? ?Objective   ? ?BP 131/84 (BP Location: Left Arm, Patient Position: Sitting, Cuff Size: Normal)   Pulse 86   Temp 98.4 ?F (36.9 ?C) (Oral)   Ht 5\' 8"  (1.727 m)   Wt 172 lb (78 kg)   SpO2 98%   BMI 26.15 kg/m?  ? ?Physical Exam ?Vitals reviewed.  ?Constitutional:   ?   Appearance: Normal appearance. He is well-developed. He is obese. He is not diaphoretic.  ?HENT:  ?   Head: Normocephalic and atraumatic.  ?   Nose: Congestion and rhinorrhea present. No nasal deformity, septal deviation or mucosal edema.  ?   Right Sinus: No maxillary sinus tenderness or frontal sinus tenderness.  ?   Left Sinus: No  maxillary sinus tenderness or frontal sinus tenderness.  ?   Mouth/Throat:  ?   Mouth: Mucous membranes are moist.  ?   Pharynx: Oropharynx is clear. No oropharyngeal exudate.  ?   Comments: Good dentition ?Eyes:  ?   General: No scleral icterus. ?   Conjunctiva/sclera: Conjunctivae normal.  ?   Pupils: Pupils are equal, round, and reactive to light.  ?Neck:  ?   Thyroid: No thyromegaly.  ?   Vascular: No carotid bruit or JVD.  ?   Trachea: Trachea normal. No tracheal tenderness or tracheal deviation.  ?Cardiovascular:  ?   Rate and Rhythm: Normal rate and regular rhythm.  ?   Chest Wall: PMI is not displaced.  ?   Pulses: Normal pulses. No decreased pulses.  ?   Heart sounds: Normal heart sounds, S1 normal and S2 normal. Heart sounds not distant. No murmur heard. ?No systolic murmur is present.  ?No diastolic murmur is present.  ?  No friction rub. No gallop. No S3 or S4 sounds.  ?Pulmonary:  ?   Effort: No tachypnea, accessory muscle usage or respiratory distress.  ?   Breath sounds: No stridor. No decreased breath sounds, wheezing, rhonchi or rales.  ?Chest:  ?   Chest wall: No tenderness.  ?Abdominal:  ?    General: Bowel sounds are normal. There is no distension.  ?   Palpations: Abdomen is soft. Abdomen is not rigid.  ?   Tenderness: There is no abdominal tenderness. There is no guarding or rebound.  ?Musculoskeleta

## 2022-04-07 ENCOUNTER — Encounter: Payer: Self-pay | Admitting: Critical Care Medicine

## 2022-04-07 ENCOUNTER — Other Ambulatory Visit: Payer: Self-pay | Admitting: Critical Care Medicine

## 2022-04-07 ENCOUNTER — Ambulatory Visit: Payer: Medicaid Other | Attending: Critical Care Medicine | Admitting: Critical Care Medicine

## 2022-04-07 ENCOUNTER — Ambulatory Visit
Admission: RE | Admit: 2022-04-07 | Discharge: 2022-04-07 | Disposition: A | Discharge status: Home / Self Care | Payer: Medicaid Other | Source: Ambulatory Visit | Attending: Critical Care Medicine | Admitting: Critical Care Medicine

## 2022-04-07 VITALS — BP 131/84 | HR 86 | Temp 98.4°F | Ht 68.0 in | Wt 172.0 lb

## 2022-04-07 DIAGNOSIS — F332 Major depressive disorder, recurrent severe without psychotic features: Secondary | ICD-10-CM | POA: Insufficient documentation

## 2022-04-07 DIAGNOSIS — H1045 Other chronic allergic conjunctivitis: Secondary | ICD-10-CM | POA: Insufficient documentation

## 2022-04-07 DIAGNOSIS — J301 Allergic rhinitis due to pollen: Secondary | ICD-10-CM

## 2022-04-07 DIAGNOSIS — J4551 Severe persistent asthma with (acute) exacerbation: Secondary | ICD-10-CM | POA: Insufficient documentation

## 2022-04-07 DIAGNOSIS — Z139 Encounter for screening, unspecified: Secondary | ICD-10-CM | POA: Diagnosis not present

## 2022-04-07 DIAGNOSIS — J4521 Mild intermittent asthma with (acute) exacerbation: Secondary | ICD-10-CM | POA: Diagnosis not present

## 2022-04-07 DIAGNOSIS — R053 Chronic cough: Secondary | ICD-10-CM

## 2022-04-07 DIAGNOSIS — Z114 Encounter for screening for human immunodeficiency virus [HIV]: Secondary | ICD-10-CM | POA: Diagnosis not present

## 2022-04-07 DIAGNOSIS — J309 Allergic rhinitis, unspecified: Secondary | ICD-10-CM | POA: Diagnosis not present

## 2022-04-07 DIAGNOSIS — J3081 Allergic rhinitis due to animal (cat) (dog) hair and dander: Secondary | ICD-10-CM | POA: Insufficient documentation

## 2022-04-07 DIAGNOSIS — L209 Atopic dermatitis, unspecified: Secondary | ICD-10-CM | POA: Insufficient documentation

## 2022-04-07 DIAGNOSIS — Z1159 Encounter for screening for other viral diseases: Secondary | ICD-10-CM

## 2022-04-07 DIAGNOSIS — Z91018 Allergy to other foods: Secondary | ICD-10-CM | POA: Insufficient documentation

## 2022-04-07 DIAGNOSIS — J4530 Mild persistent asthma, uncomplicated: Secondary | ICD-10-CM | POA: Insufficient documentation

## 2022-04-07 MED ORDER — ESCITALOPRAM OXALATE 10 MG PO TABS: 10.0000 mg | ORAL_TABLET | Freq: Every day | ORAL | 5 refills | Status: DC

## 2022-04-07 MED ORDER — AZELASTINE HCL 0.1 % NA SOLN: 2.0000 | Freq: Two times a day (BID) | NASAL | 12 refills | Status: DC

## 2022-04-07 MED ORDER — CETIRIZINE HCL 10 MG PO TABS: 10.0000 mg | ORAL_TABLET | Freq: Every day | ORAL | 11 refills | Status: DC

## 2022-04-07 MED ORDER — ALBUTEROL SULFATE HFA 108 (90 BASE) MCG/ACT IN AERS: 2.0000 | INHALATION_SPRAY | Freq: Four times a day (QID) | RESPIRATORY_TRACT | 1 refills | Status: DC | PRN

## 2022-04-07 MED ORDER — FLUTICASONE PROPIONATE HFA 110 MCG/ACT IN AERO: INHALATION_SPRAY | RESPIRATORY_TRACT | 6 refills | Status: DC

## 2022-04-07 MED ORDER — MONTELUKAST SODIUM 10 MG PO TABS: ORAL_TABLET | ORAL | 2 refills | Status: DC

## 2022-04-07 MED ORDER — FLUTICASONE PROPIONATE 50 MCG/ACT NA SUSP: NASAL | 3 refills | Status: DC

## 2022-04-07 MED ORDER — IPRATROPIUM-ALBUTEROL 0.5-2.5 (3) MG/3ML IN SOLN: 3.0000 mL | Freq: Four times a day (QID) | RESPIRATORY_TRACT | 0 refills | Status: DC | PRN

## 2022-04-07 NOTE — Assessment & Plan Note (Signed)
Does have a chronic cough seems to be improving we will monitor ?

## 2022-04-07 NOTE — Assessment & Plan Note (Signed)
Recommend addition of nasal Astelin to current oral antihistamine program and continue montelukast ?

## 2022-04-07 NOTE — Patient Instructions (Signed)
Referral to [facility] medication management clinic will be made ? ?Start Lexapro 1 pill daily for depression ? ?Obtain chest x-ray today ? ?Begin Astelin 2 sprays each nostril twice daily ? ?Refills on all her medications sent to your [facility] pharmacy ? ?Health screening labs will be obtained today ? ?Return to see Dr. Delford Field 4 months ? ? ?

## 2022-04-07 NOTE — Assessment & Plan Note (Signed)
Mild persistent asthma on Flovent and Flonase as needed albuterol no changes made ?We will obtain chest x-ray ?

## 2022-04-07 NOTE — Assessment & Plan Note (Signed)
Continue topical Pataday ?

## 2022-04-07 NOTE — Assessment & Plan Note (Signed)
Significant depression with some mild suicidal ideation with no form plan ? ?Referral to psychiatry Vesta Mixer was made ? ?Does not need to go to the emergency room contracts for safety with his mother ? ?Plan to begin Lexapro 10 mg daily ?

## 2022-04-07 NOTE — Progress Notes (Signed)
F/u Covid PNA from 12/01/2022 ? ?Cough ?Occasional clear phlegm ? ?

## 2022-04-08 LAB — LIPID PANEL
Chol/HDL Ratio: 6.3 ratio — ABNORMAL HIGH (ref 0.0–5.0)
Cholesterol, Total: 177 mg/dL (ref 100–199)
HDL: 28 mg/dL — ABNORMAL LOW (ref >39)
LDL Chol Calc (NIH): 113 mg/dL — ABNORMAL HIGH (ref 0–99)
Triglycerides: 203 mg/dL — ABNORMAL HIGH (ref 0–149)
VLDL Cholesterol Cal: 36 mg/dL (ref 5–40)

## 2022-04-08 LAB — CBC WITH DIFFERENTIAL/PLATELET
Basophils Absolute: 0 10*3/uL (ref 0.0–0.2)
Basos: 1 %
EOS (ABSOLUTE): 0.2 10*3/uL (ref 0.0–0.4)
Eos: 3 %
Hematocrit: 49.1 % (ref 37.5–51.0)
Hemoglobin: 17.1 g/dL (ref 13.0–17.7)
Immature Grans (Abs): 0 10*3/uL (ref 0.0–0.1)
Immature Granulocytes: 1 %
Lymphocytes Absolute: 2.1 10*3/uL (ref 0.7–3.1)
Lymphs: 48 %
MCH: 29.4 pg (ref 26.6–33.0)
MCHC: 34.8 g/dL (ref 31.5–35.7)
MCV: 84 fL (ref 79–97)
Monocytes Absolute: 0.5 10*3/uL (ref 0.1–0.9)
Monocytes: 11 %
Neutrophils Absolute: 1.6 10*3/uL (ref 1.4–7.0)
Neutrophils: 36 %
Platelets: 227 10*3/uL (ref 150–450)
RBC: 5.82 x10E6/uL — ABNORMAL HIGH (ref 4.14–5.80)
RDW: 13.2 % (ref 11.6–15.4)
WBC: 4.4 10*3/uL (ref 3.4–10.8)

## 2022-04-08 LAB — THYROID PANEL WITH TSH
Free Thyroxine Index: 1.6 (ref 1.2–4.9)
T3 Uptake Ratio: 26 % (ref 24–39)
T4, Total: 6.1 ug/dL (ref 4.5–12.0)
TSH: 3.61 u[IU]/mL (ref 0.450–4.500)

## 2022-04-08 LAB — COMPREHENSIVE METABOLIC PANEL
ALT: 29 IU/L (ref 0–44)
AST: 22 IU/L (ref 0–40)
Albumin/Globulin Ratio: 2.5 — ABNORMAL HIGH (ref 1.2–2.2)
Albumin: 4.8 g/dL (ref 4.1–5.2)
Alkaline Phosphatase: 50 IU/L (ref 44–121)
BUN/Creatinine Ratio: 14 (ref 9–20)
BUN: 14 mg/dL (ref 6–20)
Bilirubin Total: 0.4 mg/dL (ref 0.0–1.2)
CO2: 24 mmol/L (ref 20–29)
Calcium: 9.8 mg/dL (ref 8.7–10.2)
Chloride: 104 mmol/L (ref 96–106)
Creatinine, Ser: 0.98 mg/dL (ref 0.76–1.27)
Globulin, Total: 1.9 g/dL (ref 1.5–4.5)
Glucose: 79 mg/dL (ref 70–99)
Potassium: 4.5 mmol/L (ref 3.5–5.2)
Sodium: 140 mmol/L (ref 134–144)
Total Protein: 6.7 g/dL (ref 6.0–8.5)
eGFR: 112 mL/min/{1.73_m2} (ref >59)

## 2022-04-08 LAB — HCV INTERPRETATION

## 2022-04-08 LAB — HIV ANTIBODY (ROUTINE TESTING W REFLEX): HIV Screen 4th Generation wRfx: NONREACTIVE

## 2022-04-08 LAB — HEMOGLOBIN A1C
Est. average glucose Bld gHb Est-mCnc: 111 mg/dL
Hgb A1c MFr Bld: 5.5 % (ref 4.8–5.6)

## 2022-04-08 LAB — HCV AB W REFLEX TO QUANT PCR: HCV Ab: NONREACTIVE

## 2022-06-16 IMAGING — CR DG CHEST 2V
2 series · 2 of 2 positions shown · non-contrast
Comparison: 01/13/2011

CLINICAL DATA: Chest pain, cough x1 week

EXAM:
CHEST - 2 VIEW

[chest lat]
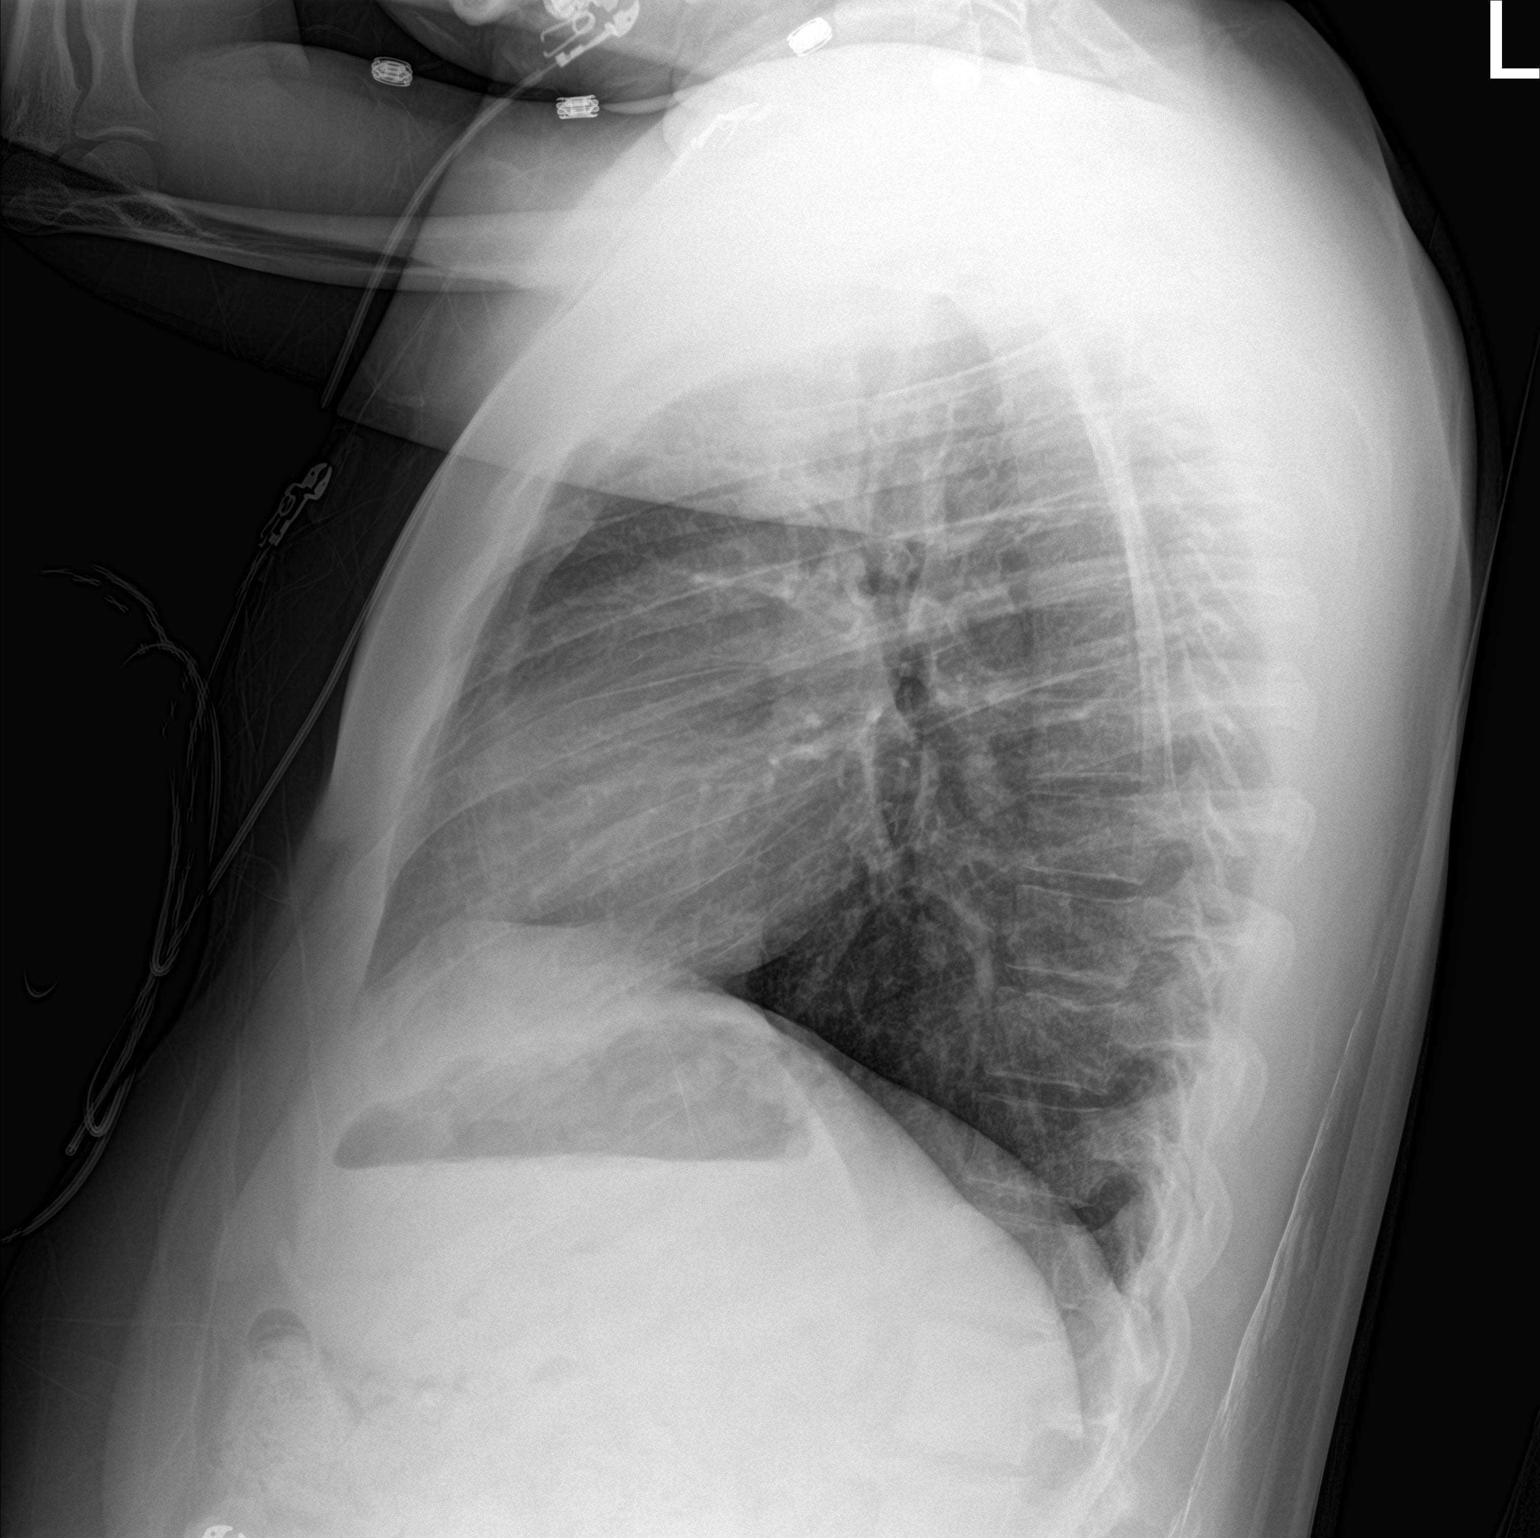

[chest ap]
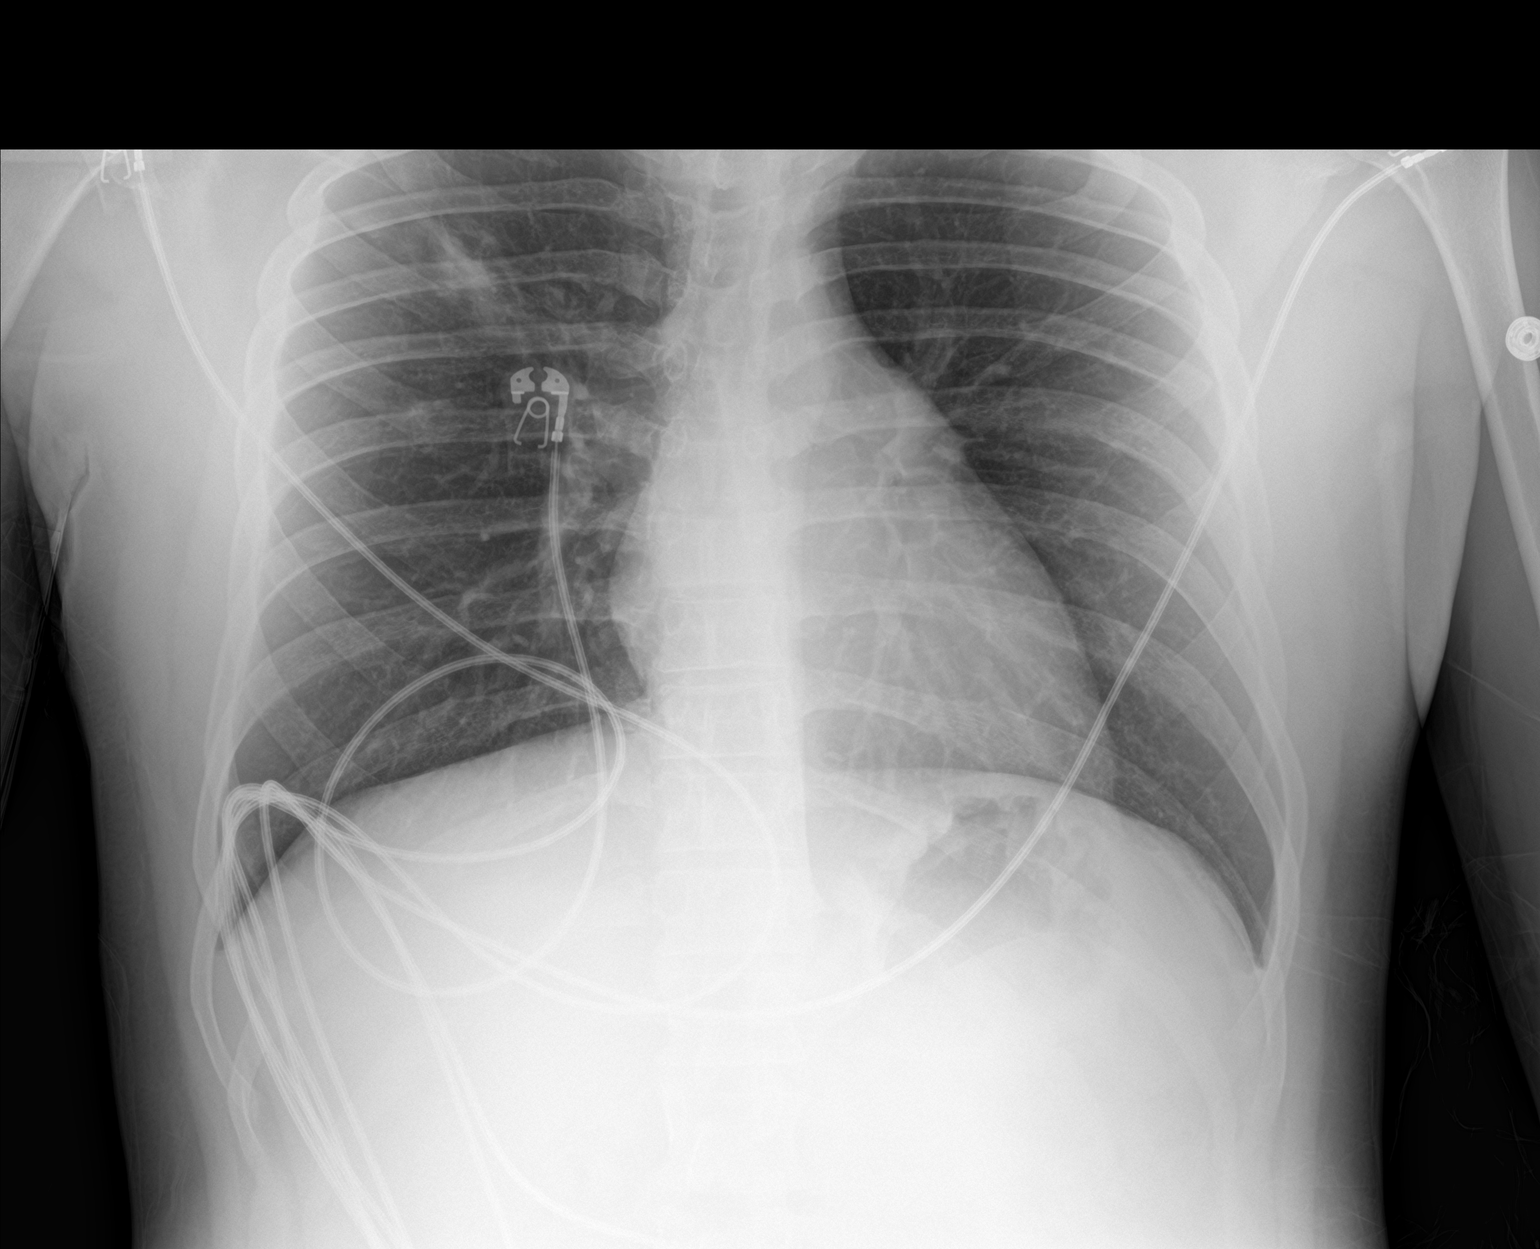

[2 of 2 positions shown; findings below may reference images not displayed]

FINDINGS: Mild patchy/nodular right upper lobe opacity, suspicious for
pneumonia. Left lung is clear. No pleural effusion or pneumothorax.

The heart is normal in size.

Visualized osseous structures are within normal limits.
IMPRESSION: Mild right upper lobe pneumonia.

## 2022-08-14 ENCOUNTER — Ambulatory Visit: Payer: Medicaid Other | Attending: Critical Care Medicine | Admitting: Critical Care Medicine

## 2022-08-14 ENCOUNTER — Encounter: Payer: Self-pay | Admitting: Critical Care Medicine

## 2022-08-14 VITALS — BP 112/73 | HR 79 | Temp 97.8°F | Ht 68.0 in | Wt 175.6 lb

## 2022-08-14 DIAGNOSIS — F32A Depression, unspecified: Secondary | ICD-10-CM | POA: Diagnosis not present

## 2022-08-14 DIAGNOSIS — J4530 Mild persistent asthma, uncomplicated: Secondary | ICD-10-CM

## 2022-08-14 DIAGNOSIS — F419 Anxiety disorder, unspecified: Secondary | ICD-10-CM | POA: Diagnosis not present

## 2022-08-14 DIAGNOSIS — F332 Major depressive disorder, recurrent severe without psychotic features: Secondary | ICD-10-CM | POA: Diagnosis not present

## 2022-08-14 DIAGNOSIS — H539 Unspecified visual disturbance: Secondary | ICD-10-CM | POA: Diagnosis not present

## 2022-08-14 DIAGNOSIS — R053 Chronic cough: Secondary | ICD-10-CM

## 2022-08-14 DIAGNOSIS — Z7951 Long term (current) use of inhaled steroids: Secondary | ICD-10-CM | POA: Insufficient documentation

## 2022-08-14 DIAGNOSIS — Z6826 Body mass index (BMI) 26.0-26.9, adult: Secondary | ICD-10-CM

## 2022-08-14 DIAGNOSIS — J301 Allergic rhinitis due to pollen: Secondary | ICD-10-CM | POA: Diagnosis not present

## 2022-08-14 DIAGNOSIS — M79672 Pain in left foot: Secondary | ICD-10-CM

## 2022-08-14 DIAGNOSIS — Z79899 Other long term (current) drug therapy: Secondary | ICD-10-CM | POA: Diagnosis not present

## 2022-08-14 DIAGNOSIS — M79671 Pain in right foot: Secondary | ICD-10-CM | POA: Diagnosis present

## 2022-08-14 DIAGNOSIS — H538 Other visual disturbances: Secondary | ICD-10-CM | POA: Diagnosis not present

## 2022-08-14 DIAGNOSIS — Z23 Encounter for immunization: Secondary | ICD-10-CM | POA: Diagnosis not present

## 2022-08-14 NOTE — Assessment & Plan Note (Signed)
Asthma stable at this time continue inhaled medications

## 2022-08-14 NOTE — Progress Notes (Signed)
Right foot pain , referral to ophthalmology.

## 2022-08-14 NOTE — Patient Instructions (Signed)
A referral to ophthalmology be made  We suggest you go to a shoe store and get a good fitting orthotic for support in your shoes you used to go to work  Stay on Lexapro and your other medications as prescribed  Flu vaccine was given   Return to Dr. Delford Field 5 months

## 2022-08-14 NOTE — Progress Notes (Signed)
New Patient Office Visit  Subjective    Patient ID: Jesse Rhodes, male    DOB: 08-Apr-2001  Age: 21 y.o. MRN: [id number]  CC:  Chief Complaint  Patient presents with   Follow-up    HPI 04/07/22 Jesse Rhodes presents to establish care This is a 21 year old male seen to establish primary care he is accompanied by his mother whose name is [name].  Note he scores high on suicidal ideation.  He does not have a plan specifically.  Also history of COVID illness December 25 with right upper lobe pneumonia.  He was treated with antibiotics and supportive care.  Patient does have severe persistent asthma with significant atopy.  He is followed by [facility].  Patient also follows at [facility] for his mental health conditions which include general anxiety and depression.  He still having a mild cough at this time.  He has no other complaints.  He tends to stay at home a lot and play video games.  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  He has no other complaints at this time.  9/7 Patient is seen again in return follow-up from a visit in May overall doing well but has had foot pain right greater than left for the past week.  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  He is now on the Lexapro 10 mg daily and he no longer has suicidal ideation mental health is improved.  He does agree to and did receive the flu vaccine.  He would like an ophthalmology referral as he has had change in his vision.  He has follow-up with Vesta Mixer who manages his mental health condition.  On arrival blood pressure is 112/73  Outpatient Encounter Medications as of 08/14/2022  Medication Sig   albuterol (VENTOLIN HFA) 108 (90 Base) MCG/ACT inhaler Inhale 2 puffs into the lungs every 6 (six) hours as needed. For wheeze or shortness of breath   azelastine (ASTELIN) 0.1 % nasal spray Place 2 sprays into both nostrils 2 (two) times daily. Use in each nostril as directed   cetirizine (ZYRTEC) 10 MG  tablet Take 1 tablet (10 mg total) by mouth at bedtime.   EPINEPHrine 0.3 mg/0.3 mL IJ SOAJ injection See admin instructions.   escitalopram (LEXAPRO) 10 MG tablet Take 1 tablet (10 mg total) by mouth daily.   fluticasone (FLONASE) 50 MCG/ACT nasal spray 1-2 sprays in each nostril   fluticasone (FLOVENT HFA) 110 MCG/ACT inhaler 2 puffs   ipratropium-albuterol (DUONEB) 0.5-2.5 (3) MG/3ML SOLN Take 3 mLs by nebulization every 6 (six) hours as needed.   montelukast (SINGULAIR) 10 MG tablet 1 tablet in the evening   Olopatadine HCl 0.2 % SOLN 1 drop into affected eye   Spacer/Aero-Holding Chambers (AEROCHAMBER PLUS FLO-VU LARGE) MISC See admin instructions.   No facility-administered encounter medications on file as of 08/14/2022.    Past Medical History:  Diagnosis Date   ADHD (attention deficit hyperactivity disorder)    Allergy    Asthma    Autism    Constipation     Past Surgical History:  Procedure Laterality Date   ADENOIDECTOMY      Family History  Problem Relation Age of Onset   Hypertension Mother     Social History   Socioeconomic History   Marital status: Single    Spouse name: Not on file   Number of children: Not on file   Years of education: Not on file  Highest education level: Not on file  Occupational History   Occupation: sales  Tobacco Use   Smoking status: Never   Smokeless tobacco: Never  Vaping Use   Vaping Use: Never used  Substance and Sexual Activity   Alcohol use: Never   Drug use: Never   Sexual activity: Never  Other Topics Concern   Not on file  Social History Narrative   Not on file   Social Determinants of Health   Financial Resource Strain: Not on file  Food Insecurity: Not on file  Transportation Needs: Not on file  Physical Activity: Not on file  Stress: Not on file  Social Connections: Not on file  Intimate Partner Violence: Not on file    Review of Systems  Constitutional:  Negative for fever.  HENT:  Negative for  congestion, ear discharge, ear pain, hearing loss, nosebleeds, sinus pain and sore throat.   Eyes: Negative.   Respiratory:  Negative for cough, shortness of breath and wheezing.   Cardiovascular:  Negative for chest pain.  Gastrointestinal: Negative.   Genitourinary: Negative.   Musculoskeletal:        Foot pain  Neurological: Negative.   Endo/Heme/Allergies:  Negative for environmental allergies and polydipsia. Does not bruise/bleed easily.  Psychiatric/Behavioral:  Negative for depression. The patient is not nervous/anxious.         Objective    BP 112/73   Pulse 79   Temp 97.8 F (36.6 C) (Oral)   Ht 5\' 8"  (1.727 m)   Wt 175 lb 9.6 oz (79.7 kg)   SpO2 97%   BMI 26.70 kg/m   Physical Exam Vitals reviewed.  Constitutional:      Appearance: Normal appearance. He is well-developed. He is obese. He is not diaphoretic.  HENT:     Head: Normocephalic and atraumatic.     Nose: No nasal deformity, septal deviation, mucosal edema, congestion or rhinorrhea.     Right Sinus: No maxillary sinus tenderness or frontal sinus tenderness.     Left Sinus: No maxillary sinus tenderness or frontal sinus tenderness.     Mouth/Throat:     Mouth: Mucous membranes are moist.     Pharynx: Oropharynx is clear. No oropharyngeal exudate.     Comments: Good dentition Eyes:     General: No scleral icterus.    Conjunctiva/sclera: Conjunctivae normal.     Pupils: Pupils are equal, round, and reactive to light.  Neck:     Thyroid: No thyromegaly.     Vascular: No carotid bruit or JVD.     Trachea: Trachea normal. No tracheal tenderness or tracheal deviation.  Cardiovascular:     Rate and Rhythm: Normal rate and regular rhythm.     Chest Wall: PMI is not displaced.     Pulses: Normal pulses. No decreased pulses.     Heart sounds: Normal heart sounds, S1 normal and S2 normal. Heart sounds not distant. No murmur heard.    No systolic murmur is present.     No diastolic murmur is present.      No friction rub. No gallop. No S3 or S4 sounds.  Pulmonary:     Effort: No tachypnea, accessory muscle usage or respiratory distress.     Breath sounds: No stridor. No decreased breath sounds, wheezing, rhonchi or rales.  Chest:     Chest wall: No tenderness.  Abdominal:     General: Bowel sounds are normal. There is no distension.     Palpations: Abdomen is soft. Abdomen is  not rigid.     Tenderness: There is no abdominal tenderness. There is no guarding or rebound.  Musculoskeletal:        General: Tenderness present. Normal range of motion.     Cervical back: Normal range of motion and neck supple. No edema, erythema or rigidity. No muscular tenderness. Normal range of motion.     Comments: Tender to the plantar aspect of both feet anteriorly  Lymphadenopathy:     Head:     Right side of head: No submental or submandibular adenopathy.     Left side of head: No submental or submandibular adenopathy.     Cervical: No cervical adenopathy.  Skin:    General: Skin is warm and dry.     Coloration: Skin is not pale.     Findings: No rash.     Nails: There is no clubbing.  Neurological:     Mental Status: He is alert and oriented to person, place, and time.     Sensory: No sensory deficit.  Psychiatric:        Attention and Perception: Attention and perception normal.        Mood and Affect: Mood is depressed. Affect is flat.        Speech: Speech normal.        Behavior: Behavior normal. Behavior is cooperative.        Thought Content: Thought content is not paranoid or delusional. Thought content includes suicidal ideation. Thought content does not include homicidal ideation. Thought content does not include homicidal or suicidal plan.        Cognition and Memory: Cognition and memory normal.        Judgment: Judgment normal.         Assessment & Plan:   Problem List Items Addressed This Visit       Respiratory   Allergic rhinitis due to pollen    Continue with allergic  rhinitis management per allergy      Mild persistent asthma, uncomplicated    Asthma stable at this time continue inhaled medications        Other   Chronic cough    Cough has resolved      Severe episode of recurrent major depressive disorder, without psychotic features (HCC)    Mental health improved depression responded well to Lexapro      Foot pain, bilateral    Needs improved arch support recommended orthotics for his work shoes      Other Visit Diagnoses     Visual changes    -  Primary   Relevant Orders   Ambulatory referral to Ophthalmology   Need for immunization against influenza       Relevant Orders   Flu Vaccine QUAD 55mo+IM (Fluarix, Fluzone & Alfiuria Quad PF) (Completed)      Return in about 5 months (around 01/14/2023) for asthma.   Shan Levans, MD

## 2022-08-14 NOTE — Assessment & Plan Note (Signed)
Continue with allergic rhinitis management per allergy

## 2022-08-14 NOTE — Assessment & Plan Note (Signed)
Cough has resolved 

## 2022-08-14 NOTE — Assessment & Plan Note (Signed)
Needs improved arch support recommended orthotics for his work shoes

## 2022-08-14 NOTE — Assessment & Plan Note (Signed)
Mental health improved depression responded well to Lexapro

## 2023-01-15 ENCOUNTER — Ambulatory Visit: Payer: Medicaid Other | Attending: Critical Care Medicine | Admitting: Critical Care Medicine

## 2023-01-15 ENCOUNTER — Encounter: Payer: Self-pay | Admitting: Critical Care Medicine

## 2023-01-15 VITALS — BP 130/84 | HR 92 | Ht 68.0 in | Wt 181.0 lb

## 2023-01-15 DIAGNOSIS — E785 Hyperlipidemia, unspecified: Secondary | ICD-10-CM | POA: Diagnosis not present

## 2023-01-15 DIAGNOSIS — Z8616 Personal history of COVID-19: Secondary | ICD-10-CM | POA: Diagnosis not present

## 2023-01-15 DIAGNOSIS — Z79899 Other long term (current) drug therapy: Secondary | ICD-10-CM | POA: Diagnosis not present

## 2023-01-15 DIAGNOSIS — M79671 Pain in right foot: Secondary | ICD-10-CM | POA: Insufficient documentation

## 2023-01-15 DIAGNOSIS — H1045 Other chronic allergic conjunctivitis: Secondary | ICD-10-CM | POA: Insufficient documentation

## 2023-01-15 DIAGNOSIS — J0101 Acute recurrent maxillary sinusitis: Secondary | ICD-10-CM | POA: Insufficient documentation

## 2023-01-15 DIAGNOSIS — E7800 Pure hypercholesterolemia, unspecified: Secondary | ICD-10-CM | POA: Diagnosis not present

## 2023-01-15 DIAGNOSIS — J309 Allergic rhinitis, unspecified: Secondary | ICD-10-CM | POA: Diagnosis not present

## 2023-01-15 DIAGNOSIS — Z7951 Long term (current) use of inhaled steroids: Secondary | ICD-10-CM | POA: Insufficient documentation

## 2023-01-15 DIAGNOSIS — F332 Major depressive disorder, recurrent severe without psychotic features: Secondary | ICD-10-CM | POA: Diagnosis not present

## 2023-01-15 DIAGNOSIS — F338 Other recurrent depressive disorders: Secondary | ICD-10-CM | POA: Insufficient documentation

## 2023-01-15 DIAGNOSIS — J4521 Mild intermittent asthma with (acute) exacerbation: Secondary | ICD-10-CM | POA: Diagnosis not present

## 2023-01-15 DIAGNOSIS — F419 Anxiety disorder, unspecified: Secondary | ICD-10-CM | POA: Diagnosis not present

## 2023-01-15 DIAGNOSIS — J4551 Severe persistent asthma with (acute) exacerbation: Secondary | ICD-10-CM | POA: Insufficient documentation

## 2023-01-15 DIAGNOSIS — R0982 Postnasal drip: Secondary | ICD-10-CM | POA: Diagnosis not present

## 2023-01-15 DIAGNOSIS — R45851 Suicidal ideations: Secondary | ICD-10-CM | POA: Insufficient documentation

## 2023-01-15 DIAGNOSIS — E782 Mixed hyperlipidemia: Secondary | ICD-10-CM

## 2023-01-15 DIAGNOSIS — M79672 Pain in left foot: Secondary | ICD-10-CM | POA: Insufficient documentation

## 2023-01-15 DIAGNOSIS — J4530 Mild persistent asthma, uncomplicated: Secondary | ICD-10-CM

## 2023-01-15 MED ORDER — ESCITALOPRAM OXALATE 10 MG PO TABS: 10.0000 mg | ORAL_TABLET | Freq: Every day | ORAL | 5 refills | Status: AC

## 2023-01-15 MED ORDER — FLUTICASONE PROPIONATE 50 MCG/ACT NA SUSP: NASAL | 3 refills | Status: AC

## 2023-01-15 MED ORDER — AZITHROMYCIN 250 MG PO TABS: ORAL_TABLET | ORAL | 0 refills | Status: DC

## 2023-01-15 MED ORDER — CETIRIZINE HCL 10 MG PO TABS: 10.0000 mg | ORAL_TABLET | Freq: Every day | ORAL | 11 refills | Status: DC

## 2023-01-15 MED ORDER — MONTELUKAST SODIUM 10 MG PO TABS: ORAL_TABLET | ORAL | 2 refills | Status: AC

## 2023-01-15 MED ORDER — AZELASTINE HCL 0.1 % NA SOLN: 2.0000 | Freq: Two times a day (BID) | NASAL | 12 refills | Status: AC

## 2023-01-15 MED ORDER — FLUTICASONE PROPIONATE HFA 110 MCG/ACT IN AERO: INHALATION_SPRAY | RESPIRATORY_TRACT | 6 refills | Status: DC

## 2023-01-15 MED ORDER — ALBUTEROL SULFATE HFA 108 (90 BASE) MCG/ACT IN AERS: 2.0000 | INHALATION_SPRAY | Freq: Four times a day (QID) | RESPIRATORY_TRACT | 1 refills | Status: AC | PRN

## 2023-01-15 NOTE — Assessment & Plan Note (Signed)
Resolved we will monitor 

## 2023-01-15 NOTE — Assessment & Plan Note (Signed)
Continue with current therapy.  

## 2023-01-15 NOTE — Assessment & Plan Note (Signed)
No medications given will use lifestyle approach        Advice for Weight Management   -For most of [place] the best way to lose weight is by diet management. Generally speaking, diet management means consuming less calories intentionally which over time brings about progressive weight loss.  This can be achieved more effectively by avoiding ultra processed carbohydrates, processed meats, unhealthy fats.    It is critically important to know your numbers: how much calorie you are consuming and how much calorie you need. More importantly, our carbohydrates sources should be unprocessed naturally occurring  complex starch food items.  It is always important to balance nutrition also by  appropriate intake of proteins (mainly plant-based), healthy fats/oils, plenty of fruits and vegetables.    -The American College of Lifestyle Medicine (ACL M) recommends nutrition derived mostly from Whole Food, Plant Predominant Sources example an apple instead of applesauce or apple pie. Eat Plenty of vegetables, Mushrooms, fruits, Legumes, Whole Grains, Nuts, seeds in lieu of processed meats, processed snacks/pastries red meat, poultry, eggs.  Use only water or unsweetened tea for hydration.  The College also recommends the need to stay away from risky substances including alcohol, smoking; obtaining 7-9 hours of restorative sleep, at least 150 minutes of moderate intensity exercise weekly, importance of healthy social connections, and being mindful of stress and seek help when it is overwhelming.     -Sticking to a routine mealtime to eat 3 meals a day and avoiding unnecessary snacks is shown to have a big role in weight control. Under normal circumstances, the only time we burn stored energy is when we are hungry, so allow  some hunger to take place- hunger means no food between appropriate meal times, only water.  It is not advisable to starve.    -It is better to avoid simple carbohydrates including: Cakes, Sweet Desserts,  Ice Cream, Soda (diet and regular), Sweet Tea, Candies, Chips, Cookies, Store Bought Juices, Alcohol in Excess of  1-2 drinks a day, Lemonade,  Artificial Sweeteners, Doughnuts, Coffee Creamers, "Sugar-free" Products, etc, etc.  This is not a complete list...Marland Kitchen.    -Consulting with certified diabetes educators is proven to provide you with the most accurate and current information on diet.  Also, you may be  interested in discussing diet options/exchanges , we can schedule a visit with Jearld Fenton, RDN, CDE for individualized nutrition education.   -Exercise: If you are able: 30 -60 minutes a day ,4 days a week, or 150 minutes of moderate intensity exercise weekly.    The longer the better if tolerated.  Combine stretch, strength, and aerobic activities.  If you were told in the past that you have high risk for cardiovascular diseases, or if you are currently symptomatic, you may seek evaluation by your heart doctor prior to initiating moderate to intense exercise programs.                                    Additional Care Considerations for Diabetes/Prediabetes     -Diabetes  is a chronic disease.  The most important care consideration is regular follow-up with your diabetes care provider with the goal being avoiding or delaying its complications and to take advantage of advances in medications and technology.  If appropriate actions are taken early enough, type 2 diabetes can even be reversed.  Seek information from the right source.   - Whole Food, Plant  Predominant Nutrition is highly recommended: Eat Plenty of vegetables, Mushrooms, fruits, Legumes, Whole Grains, Nuts, seeds in lieu of processed meats, processed snacks/pastries red meat, poultry, eggs as recommended by [employer] of  Lifestyle Medicine (ACLM).   -Type 2 diabetes is known to coexist with other important comorbidities such as high blood pressure and high cholesterol.  It is critical to control not only the diabetes but also  the high blood pressure and high cholesterol to minimize and delay the risk of complications including coronary artery disease, stroke, amputations, blindness, etc.  The good news is that this diet recommendation for type 2 diabetes is also very helpful for managing high cholesterol and high blood blood pressure.   - Studies showed that people with diabetes will benefit from a class of medications known as ACE inhibitors and statins.  Unless there are specific reasons not to be on these medications, the standard of care is to consider getting one from these groups of medications at an optimal doses.  These medications are generally considered safe and proven to help protect the heart and the kidneys.     - People with diabetes are encouraged to initiate and maintain regular follow-up with eye doctors, foot doctors, dentists , and if necessary heart and kidney doctors.      - It is highly recommended that people with diabetes quit smoking or stay away from smoking, and get yearly  flu vaccine and pneumonia vaccine at least every 5 years.  See above for additional recommendations on exercise, sleep, stress management , and healthy social connections.

## 2023-01-15 NOTE — Patient Instructions (Signed)
Take azithromycin 2 the first day then 1 thereafter till gone Pick up a saline nasal rinse Arm Hammer is in good one 3 sprays each nostril 2-3 times daily  Refills on all medications sent to pharmacy  Follow the lifestyle medicine approach as we discussed see below         Advice for Weight Management   -For most of [place] the best way to lose weight is by diet management. Generally speaking, diet management means consuming less calories intentionally which over time brings about progressive weight loss.  This can be achieved more effectively by avoiding ultra processed carbohydrates, processed meats, unhealthy fats.    It is critically important to know your numbers: how much calorie you are consuming and how much calorie you need. More importantly, our carbohydrates sources should be unprocessed naturally occurring  complex starch food items.  It is always important to balance nutrition also by  appropriate intake of proteins (mainly plant-based), healthy fats/oils, plenty of fruits and vegetables.    -The American College of Lifestyle Medicine (ACL M) recommends nutrition derived mostly from Whole Food, Plant Predominant Sources example an apple instead of applesauce or apple pie. Eat Plenty of vegetables, Mushrooms, fruits, Legumes, Whole Grains, Nuts, seeds in lieu of processed meats, processed snacks/pastries red meat, poultry, eggs.  Use only water or unsweetened tea for hydration.  The College also recommends the need to stay away from risky substances including alcohol, smoking; obtaining 7-9 hours of restorative sleep, at least 150 minutes of moderate intensity exercise weekly, importance of healthy social connections, and being mindful of stress and seek help when it is overwhelming.     -Sticking to a routine mealtime to eat 3 meals a day and avoiding unnecessary snacks is shown to have a big role in weight control. Under normal circumstances, the only time we burn stored energy is when we are  hungry, so allow  some hunger to take place- hunger means no food between appropriate meal times, only water.  It is not advisable to starve.    -It is better to avoid simple carbohydrates including: Cakes, Sweet Desserts, Ice Cream, Soda (diet and regular), Sweet Tea, Candies, Chips, Cookies, Store Bought Juices, Alcohol in Excess of  1-2 drinks a day, Lemonade,  Artificial Sweeteners, Doughnuts, Coffee Creamers, "Sugar-free" Products, etc, etc.  This is not a complete list...Marland Kitchen.    -Consulting with certified diabetes educators is proven to provide you with the most accurate and current information on diet.  Also, you may be  interested in discussing diet options/exchanges , we can schedule a visit with Jearld Fenton, RDN, CDE for individualized nutrition education.   -Exercise: If you are able: 30 -60 minutes a day ,4 days a week, or 150 minutes of moderate intensity exercise weekly.    The longer the better if tolerated.  Combine stretch, strength, and aerobic activities.  If you were told in the past that you have high risk for cardiovascular diseases, or if you are currently symptomatic, you may seek evaluation by your heart doctor prior to initiating moderate to intense exercise programs.                                    Additional Care Considerations for Diabetes/Prediabetes     -Diabetes  is a chronic disease.  The most important care consideration is regular follow-up with your diabetes care provider with the goal being  avoiding or delaying its complications and to take advantage of advances in medications and technology.  If appropriate actions are taken early enough, type 2 diabetes can even be reversed.  Seek information from the right source.   - Whole Food, Plant Predominant Nutrition is highly recommended: Eat Plenty of vegetables, Mushrooms, fruits, Legumes, Whole Grains, Nuts, seeds in lieu of processed meats, processed snacks/pastries red meat, poultry, eggs as recommended by  [employer] of  [employer]estyle Medicine (ACLM).   -Type 2 diabetes is known to coexist with other important comorbidities such as high blood pressure and high cholesterol.  It is critical to control not only the diabetes but also the high blood pressure and high cholesterol to minimize and delay the risk of complications including coronary artery disease, stroke, amputations, blindness, etc.  The good news is that this diet recommendation for type 2 diabetes is also very helpful for managing high cholesterol and high blood blood pressure.   - Studies showed that people with diabetes will benefit from a class of medications known as ACE inhibitors and statins.  Unless there are specific reasons not to be on these medications, the standard of care is to consider getting one from these groups of medications at an optimal doses.  These medications are generally considered safe and proven to help protect the heart and the kidneys.     - People with diabetes are encouraged to initiate and maintain regular follow-up with eye doctors, foot doctors, dentists , and if necessary heart and kidney doctors.      - It is highly recommended that people with diabetes quit smoking or stay away from smoking, and get yearly  flu vaccine and pneumonia vaccine at least every 5 years.  See above for additional recommendations on exercise, sleep, stress management , and healthy social connections.

## 2023-01-15 NOTE — Progress Notes (Signed)
New Patient Office Visit  Subjective    Patient ID: Jesse Rhodes, male    DOB: Jun 18, 2001  Age: 22 y.o. MRN: [id number]  CC:  Chief Complaint  Patient presents with   Nasal Congestion    HPI 04/07/22 Jesse Rhodes presents to establish care This is a 22 year old male seen to establish primary care he is accompanied by his mother whose name is Jesse Rhodes.  Note he scores high on suicidal ideation.  He does not have a plan specifically.  Also history of COVID illness December 25 with right upper lobe pneumonia.  He was treated with antibiotics and supportive care.  Patient does have severe persistent asthma with significant atopy.  He is followed by [place] [facility] and asthma.  Patient also follows at [facility] for his mental health conditions which include general anxiety and depression.  He still having a mild cough at this time.  He has no other complaints.  He tends to stay at home a lot and play video games.  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  He has no other complaints at this time.  9/7 Patient is seen again in return follow-up from a visit in May overall doing well but has had foot pain right greater than left for the past week.  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  He is now on the Lexapro 10 mg daily and he no longer has suicidal ideation mental health is improved.  He does agree to and did receive the flu vaccine.  He would like an ophthalmology referral as he has had change in his vision.  He has follow-up with Beverly Sessions who manages his mental health condition.  On arrival blood pressure is 112/73  01/15/23 This patient is seen in return follow-up accompanied by his father he overall has been doing very well he no longer has evidence of suicidality.  Asthma has been stable.  He no longer has a dog in the house.  He does have some postnasal drainage and cough this productive of yellow mucus and brown mucus coming out of the sinuses.  He has been compliant with all  his inhaled medications.  His weight is gone up with a BMI of 27.5.  He does have a mildly elevated cholesterol and has been trying dietary change is not physically active watches video games all day Outpatient Encounter Medications as of 01/15/2023  Medication Sig   azithromycin (ZITHROMAX) 250 MG tablet Take two once then one daily until gone   EPINEPHrine 0.3 mg/0.3 mL IJ SOAJ injection See admin instructions.   ipratropium-albuterol (DUONEB) 0.5-2.5 (3) MG/3ML SOLN Take 3 mLs by nebulization every 6 (six) hours as needed.   Olopatadine HCl 0.2 % SOLN 1 drop into affected eye   Spacer/Aero-Holding Chambers (AEROCHAMBER PLUS FLO-VU LARGE) MISC See admin instructions.   [DISCONTINUED] albuterol (VENTOLIN HFA) 108 (90 Base) MCG/ACT inhaler Inhale 2 puffs into the lungs every 6 (six) hours as needed. For wheeze or shortness of breath   [DISCONTINUED] azelastine (ASTELIN) 0.1 % nasal spray Place 2 sprays into both nostrils 2 (two) times daily. Use in each nostril as directed   [DISCONTINUED] cetirizine (ZYRTEC) 10 MG tablet Take 1 tablet (10 mg total) by mouth at bedtime.   [DISCONTINUED] escitalopram (LEXAPRO) 10 MG tablet Take 1 tablet (10 mg total) by mouth daily.   [DISCONTINUED] fluticasone (FLONASE) 50 MCG/ACT nasal spray 1-2 sprays in each nostril   [DISCONTINUED] fluticasone (FLOVENT HFA) 110 MCG/ACT  inhaler 2 puffs   [DISCONTINUED] montelukast (SINGULAIR) 10 MG tablet 1 tablet in the evening   albuterol (VENTOLIN HFA) 108 (90 Base) MCG/ACT inhaler Inhale 2 puffs into the lungs every 6 (six) hours as needed. For wheeze or shortness of breath   azelastine (ASTELIN) 0.1 % nasal spray Place 2 sprays into both nostrils 2 (two) times daily. Use in each nostril as directed   cetirizine (ZYRTEC) 10 MG tablet Take 1 tablet (10 mg total) by mouth at bedtime.   escitalopram (LEXAPRO) 10 MG tablet Take 1 tablet (10 mg total) by mouth daily.   fluticasone (FLONASE) 50 MCG/ACT nasal spray 1-2 sprays in  each nostril   fluticasone (FLOVENT HFA) 110 MCG/ACT inhaler 2 puffs   montelukast (SINGULAIR) 10 MG tablet 1 tablet in the evening   No facility-administered encounter medications on file as of 01/15/2023.    Past Medical History:  Diagnosis Date   ADHD (attention deficit hyperactivity disorder)    Allergy    Asthma    Autism    Constipation     Past Surgical History:  Procedure Laterality Date   ADENOIDECTOMY      Family History  Problem Relation Age of Onset   Hypertension Mother     Social History   Socioeconomic History   Marital status: Single    Spouse name: Not on file   Number of children: Not on file   Years of education: Not on file   Highest education level: Not on file  Occupational History   Occupation: sales  Tobacco Use   Smoking status: Never   Smokeless tobacco: Never  Vaping Use   Vaping Use: Never used  Substance and Sexual Activity   Alcohol use: Never   Drug use: Never   Sexual activity: Never  Other Topics Concern   Not on file  Social History Narrative   Not on file   Social Determinants of Health   Financial Resource Strain: Not on file  Food Insecurity: Not on file  Transportation Needs: Not on file  Physical Activity: Not on file  Stress: Not on file  Social Connections: Not on file  Intimate Partner Violence: Not on file    Review of Systems  Constitutional:  Negative for chills, diaphoresis, fever, malaise/fatigue and weight loss.  HENT:  Positive for congestion, sinus pain and sore throat. Negative for ear discharge, ear pain, hearing loss, nosebleeds and tinnitus.   Eyes: Negative.  Negative for blurred vision, photophobia and redness.  Respiratory:  Positive for cough. Negative for hemoptysis, sputum production, shortness of breath, wheezing and stridor.   Cardiovascular:  Negative for chest pain, palpitations, orthopnea, claudication, leg swelling and PND.  Gastrointestinal: Negative.  Negative for abdominal pain, blood  in stool, constipation, diarrhea, heartburn, nausea and vomiting.  Genitourinary: Negative.  Negative for dysuria, flank pain, frequency, hematuria and urgency.  Musculoskeletal:  Negative for back pain, falls, joint pain, myalgias and neck pain.       Foot pain  Skin:  Negative for itching and rash.  Neurological: Negative.  Negative for dizziness, tingling, tremors, sensory change, speech change, focal weakness, seizures, loss of consciousness, weakness and headaches.  Endo/Heme/Allergies:  Negative for environmental allergies and polydipsia. Does not bruise/bleed easily.  Psychiatric/Behavioral:  Negative for depression, memory loss, substance abuse and suicidal ideas. The patient is not nervous/anxious and does not have insomnia.         Objective    BP 130/84 (BP Location: Right Arm, Patient Position: Sitting, Cuff  Size: Normal)   Pulse 92   Ht 5\' 8"  (1.727 m)   Wt 181 lb (82.1 kg)   SpO2 96%   BMI 27.52 kg/m   Physical Exam Vitals reviewed.  Constitutional:      Appearance: Normal appearance. He is well-developed. He is obese. He is not diaphoretic.  HENT:     Head: Normocephalic and atraumatic.     Nose: Congestion and rhinorrhea present. No nasal deformity, septal deviation or mucosal edema.     Right Sinus: No maxillary sinus tenderness or frontal sinus tenderness.     Left Sinus: No maxillary sinus tenderness or frontal sinus tenderness.     Comments: Bilateral nasal purulence    Mouth/Throat:     Mouth: Mucous membranes are moist.     Pharynx: Oropharynx is clear. No oropharyngeal exudate.     Comments: Good dentition Eyes:     General: No scleral icterus.    Conjunctiva/sclera: Conjunctivae normal.     Pupils: Pupils are equal, round, and reactive to light.  Neck:     Thyroid: No thyromegaly.     Vascular: No carotid bruit or JVD.     Trachea: Trachea normal. No tracheal tenderness or tracheal deviation.  Cardiovascular:     Rate and Rhythm: Normal rate and  regular rhythm.     Chest Wall: PMI is not displaced.     Pulses: Normal pulses. No decreased pulses.     Heart sounds: Normal heart sounds, S1 normal and S2 normal. Heart sounds not distant. No murmur heard.    No systolic murmur is present.     No diastolic murmur is present.     No friction rub. No gallop. No S3 or S4 sounds.  Pulmonary:     Effort: No tachypnea, accessory muscle usage or respiratory distress.     Breath sounds: No stridor. No decreased breath sounds, wheezing, rhonchi or rales.  Chest:     Chest wall: No tenderness.  Abdominal:     General: Bowel sounds are normal. There is no distension.     Palpations: Abdomen is soft. Abdomen is not rigid.     Tenderness: There is no abdominal tenderness. There is no guarding or rebound.  Musculoskeletal:        General: No tenderness. Normal range of motion.     Cervical back: Normal range of motion and neck supple. No edema, erythema or rigidity. No muscular tenderness. Normal range of motion.  Lymphadenopathy:     Head:     Right side of head: No submental or submandibular adenopathy.     Left side of head: No submental or submandibular adenopathy.     Cervical: No cervical adenopathy.  Skin:    General: Skin is warm and dry.     Coloration: Skin is not pale.     Findings: No rash.     Nails: There is no clubbing.  Neurological:     Mental Status: He is alert and oriented to person, place, and time. Mental status is at baseline.     Sensory: No sensory deficit.  Psychiatric:        Attention and Perception: Attention and perception normal.        Mood and Affect: Mood and affect normal. Mood is not depressed. Affect is not flat.        Speech: Speech normal.        Behavior: Behavior normal. Behavior is cooperative.        Thought Content: Thought  content normal. Thought content is not paranoid or delusional. Thought content does not include homicidal or suicidal ideation. Thought content does not include homicidal or  suicidal plan.        Cognition and Memory: Cognition and memory normal.        Judgment: Judgment normal.         Assessment & Plan:   Problem List Items Addressed This Visit       Respiratory   Mild persistent asthma, uncomplicated    Continue with current therapy      Relevant Medications   albuterol (VENTOLIN HFA) 108 (90 Base) MCG/ACT inhaler   fluticasone (FLOVENT HFA) 110 MCG/ACT inhaler   montelukast (SINGULAIR) 10 MG tablet   Acute recurrent maxillary sinusitis - Primary    Acute recurrent maxillary sinusitis will give a course of steroid nasal spray and azithromycin and saline rinse      Relevant Medications   azithromycin (ZITHROMAX) 250 MG tablet   azelastine (ASTELIN) 0.1 % nasal spray   cetirizine (ZYRTEC) 10 MG tablet   fluticasone (FLONASE) 50 MCG/ACT nasal spray     Other   Chronic allergic conjunctivitis    Resolved we will monitor      Severe episode of recurrent major depressive disorder, without psychotic features ([place])    Much improved stay on lexapro      Relevant Medications   escitalopram (LEXAPRO) 10 MG tablet   Foot pain, bilateral    Resolved we will monitor      Hyperlipidemia    No medications given will use lifestyle approach        Advice for Weight Management   -For most of [place] the best way to lose weight is by diet management. Generally speaking, diet management means consuming less calories intentionally which over time brings about progressive weight loss.  This can be achieved more effectively by avoiding ultra processed carbohydrates, processed meats, unhealthy fats.    It is critically important to know your numbers: how much calorie you are consuming and how much calorie you need. More importantly, our carbohydrates sources should be unprocessed naturally occurring  complex starch food items.  It is always important to balance nutrition also by  appropriate intake of proteins (mainly plant-based), healthy fats/oils, plenty of  fruits and vegetables.    -The American College of Lifestyle Medicine (ACL M) recommends nutrition derived mostly from Whole Food, Plant Predominant Sources example an apple instead of applesauce or apple pie. Eat Plenty of vegetables, Mushrooms, fruits, Legumes, Whole Grains, Nuts, seeds in lieu of processed meats, processed snacks/pastries red meat, poultry, eggs.  Use only water or unsweetened tea for hydration.  The College also recommends the need to stay away from risky substances including alcohol, smoking; obtaining 7-9 hours of restorative sleep, at least 150 minutes of moderate intensity exercise weekly, importance of healthy social connections, and being mindful of stress and seek help when it is overwhelming.     -Sticking to a routine mealtime to eat 3 meals a day and avoiding unnecessary snacks is shown to have a big role in weight control. Under normal circumstances, the only time we burn stored energy is when we are hungry, so allow  some hunger to take place- hunger means no food between appropriate meal times, only water.  It is not advisable to starve.    -It is better to avoid simple carbohydrates including: Cakes, Sweet Desserts, Ice Cream, Soda (diet and regular), Sweet Tea, Candies, Chips, Cookies, Store Bought Juices,  Alcohol in Excess of  1-2 drinks a day, Lemonade,  Artificial Sweeteners, Doughnuts, Coffee Creamers, "Sugar-free" Products, etc, etc.  This is not a complete list...Marland Kitchen.    -Consulting with certified diabetes educators is proven to provide you with the most accurate and current information on diet.  Also, you may be  interested in discussing diet options/exchanges , we can schedule a visit with Jearld Fenton, RDN, CDE for individualized nutrition education.   -Exercise: If you are able: 30 -60 minutes a day ,4 days a week, or 150 minutes of moderate intensity exercise weekly.    The longer the better if tolerated.  Combine stretch, strength, and aerobic activities.  If  you were told in the past that you have high risk for cardiovascular diseases, or if you are currently symptomatic, you may seek evaluation by your heart doctor prior to initiating moderate to intense exercise programs.                                    Additional Care Considerations for Diabetes/Prediabetes     -Diabetes  is a chronic disease.  The most important care consideration is regular follow-up with your diabetes care provider with the goal being avoiding or delaying its complications and to take advantage of advances in medications and technology.  If appropriate actions are taken early enough, type 2 diabetes can even be reversed.  Seek information from the right source.   - Whole Food, Plant Predominant Nutrition is highly recommended: Eat Plenty of vegetables, Mushrooms, fruits, Legumes, Whole Grains, Nuts, seeds in lieu of processed meats, processed snacks/pastries red meat, poultry, eggs as recommended by [employer] of  Lifestyle Medicine (ACLM).   -Type 2 diabetes is known to coexist with other important comorbidities such as high blood pressure and high cholesterol.  It is critical to control not only the diabetes but also the high blood pressure and high cholesterol to minimize and delay the risk of complications including coronary artery disease, stroke, amputations, blindness, etc.  The good news is that this diet recommendation for type 2 diabetes is also very helpful for managing high cholesterol and high blood blood pressure.   - Studies showed that people with diabetes will benefit from a class of medications known as ACE inhibitors and statins.  Unless there are specific reasons not to be on these medications, the standard of care is to consider getting one from these groups of medications at an optimal doses.  These medications are generally considered safe and proven to help protect the heart and the kidneys.     - People with diabetes are encouraged to initiate and  maintain regular follow-up with eye doctors, foot doctors, dentists , and if necessary heart and kidney doctors.      - It is highly recommended that people with diabetes quit smoking or stay away from smoking, and get yearly  flu vaccine and pneumonia vaccine at least every 5 years.  See above for additional recommendations on exercise, sleep, stress management , and healthy social connections.         Other Visit Diagnoses     Mild intermittent asthma with exacerbation       Relevant Medications   albuterol (VENTOLIN HFA) 108 (90 Base) MCG/ACT inhaler   fluticasone (FLOVENT HFA) 110 MCG/ACT inhaler   montelukast (SINGULAIR) 10 MG tablet   Allergic rhinitis, unspecified seasonality, unspecified trigger  Relevant Medications   cetirizine (ZYRTEC) 10 MG tablet     Return in about 6 months (around 07/16/2023) for chronic conditions.   Asencion Noble, MD

## 2023-01-15 NOTE — Assessment & Plan Note (Signed)
Much improved stay on lexapro

## 2023-01-15 NOTE — Assessment & Plan Note (Signed)
Acute recurrent maxillary sinusitis will give a course of steroid nasal spray and azithromycin and saline rinse

## 2023-03-21 ENCOUNTER — Encounter: Payer: Self-pay | Admitting: Critical Care Medicine

## 2023-03-21 DIAGNOSIS — L84 Corns and callosities: Secondary | ICD-10-CM

## 2023-04-06 ENCOUNTER — Ambulatory Visit (INDEPENDENT_AMBULATORY_CARE_PROVIDER_SITE_OTHER): Payer: Medicaid Other

## 2023-04-06 ENCOUNTER — Encounter: Payer: Self-pay | Admitting: Podiatry

## 2023-04-06 ENCOUNTER — Ambulatory Visit (INDEPENDENT_AMBULATORY_CARE_PROVIDER_SITE_OTHER): Payer: Medicaid Other | Admitting: Podiatry

## 2023-04-06 DIAGNOSIS — M2142 Flat foot [pes planus] (acquired), left foot: Secondary | ICD-10-CM | POA: Diagnosis not present

## 2023-04-06 DIAGNOSIS — M2141 Flat foot [pes planus] (acquired), right foot: Secondary | ICD-10-CM

## 2023-04-06 NOTE — Progress Notes (Signed)
  Subjective:  Patient ID: Jesse Rhodes, male    DOB: May 17, 2001,   MRN: [id number]  Chief Complaint  Patient presents with   Callouses    Callus on left foot     22 y.o. male presents for concern of bilateral foot pain. Relates the pain has been going on for years and hurts on the bottom of both feet. Relates he is on his feet a lot and usually hurts worse at the end of a long day . Denies any other pedal complaints. Denies n/v/f/c.   Past Medical History:  Diagnosis Date   ADHD (attention deficit hyperactivity disorder)    Allergy    Asthma    Autism    Constipation     Objective:  Physical Exam: Vascular: DP/PT pulses 2/4 bilateral. CFT <3 seconds. Normal hair growth on digits. No edema.  Skin. No lacerations or abrasions bilateral feet.  Musculoskeletal: MMT 5/5 bilateral lower extremities in DF, PF, Inversion and Eversion. Deceased ROM in DF of ankle joint. No tenderness to palpation currently. Upon standing noticeable collapse of the midfoot arch.  Neurological: Sensation intact to light touch.   Assessment:   1. Bilateral pes planus      Plan:  Patient was evaluated and treated and all questions answered. -Xrays reviewed. No acute fractures of dislocations.  -Discussed treatement options; discussed pes planus deformity;conservative and  surgical  -Discussed trying power steps. Patient will return when he has money for htem.  -Recommend good supportive shoes -Recommend daily stretching and icing -Patient to return to office as needed or sooner if condition worsens.    Louann Sjogren, DPM

## 2023-04-13 ENCOUNTER — Ambulatory Visit (HOSPITAL_COMMUNITY)
Admission: EM | Admit: 2023-04-13 | Discharge: 2023-04-13 | Disposition: A | Discharge status: Home / Self Care | Payer: Medicaid Other | Attending: Emergency Medicine | Admitting: Emergency Medicine

## 2023-04-13 ENCOUNTER — Encounter (HOSPITAL_COMMUNITY): Payer: Self-pay

## 2023-04-13 DIAGNOSIS — J069 Acute upper respiratory infection, unspecified: Secondary | ICD-10-CM

## 2023-04-13 MED ORDER — IPRATROPIUM-ALBUTEROL 0.5-2.5 (3) MG/3ML IN SOLN: 3.0000 mL | Freq: Four times a day (QID) | RESPIRATORY_TRACT | 0 refills | Status: AC | PRN

## 2023-04-13 MED ORDER — BENZONATATE 100 MG PO CAPS: 100.0000 mg | ORAL_CAPSULE | Freq: Three times a day (TID) | ORAL | 0 refills | Status: DC

## 2023-04-13 MED ORDER — PROMETHAZINE-DM 6.25-15 MG/5ML PO SYRP: 5.0000 mL | ORAL_SOLUTION | Freq: Every evening | ORAL | 0 refills | Status: DC | PRN

## 2023-04-13 MED ORDER — PREDNISONE 20 MG PO TABS: 40.0000 mg | ORAL_TABLET | Freq: Every day | ORAL | 0 refills | Status: DC

## 2023-04-13 NOTE — ED Provider Notes (Signed)
[facility]    CSN: [id number] Arrival date & time: 04/13/23  0801      History   Chief Complaint Chief Complaint  Patient presents with   Cough    HPI Jesse Rhodes is a 22 y.o. male.   Patient presents for evaluation of nasal congestion, rhinorrhea, sore throat, cough and wheezing present for 7 days.  Sore throat has resolved.  Cough is harsh, productive and persistent.  Known sick contact within household.  Has attempted use of Robitussin, Mucinex and albuterol inhaler.  History of asthma.  Denies shortness of breath, chest pain or tightness, fever chills or bodyaches.   Past Medical History:  Diagnosis Date   ADHD (attention deficit hyperactivity disorder)    Allergy    Asthma    Autism    Constipation     Patient Active Problem List   Diagnosis Date Noted   Acute recurrent maxillary sinusitis 01/15/2023   Hyperlipidemia 01/15/2023   Foot pain, bilateral 08/14/2022   Allergic rhinitis due to animal (cat) (dog) hair and dander 04/07/2022   Allergic rhinitis due to pollen 04/07/2022   Atopic dermatitis 04/07/2022   Chronic allergic conjunctivitis 04/07/2022   Food allergy 04/07/2022   Mild persistent asthma, uncomplicated 04/07/2022   Severe episode of recurrent major depressive disorder, without psychotic features (HCC) 04/07/2022   Chronic cough 11/29/2018   Non-seasonal allergic rhinitis 11/29/2018   Family history of diabetes mellitus 11/29/2018    Past Surgical History:  Procedure Laterality Date   ADENOIDECTOMY         Home Medications    Prior to Admission medications   Medication Sig Start Date End Date Taking? Authorizing Provider  albuterol (VENTOLIN HFA) 108 (90 Base) MCG/ACT inhaler Inhale 2 puffs into the lungs every 6 (six) hours as needed. For wheeze or shortness of breath 01/15/23   Storm Frisk, MD  azelastine (ASTELIN) 0.1 % nasal spray Place 2 sprays into both nostrils 2 (two) times daily. Use in each nostril as directed  01/15/23   Storm Frisk, MD  cetirizine (ZYRTEC) 10 MG tablet Take 1 tablet (10 mg total) by mouth at bedtime. 01/15/23   Storm Frisk, MD  EPINEPHrine 0.3 mg/0.3 mL IJ SOAJ injection See admin instructions.    [provider]  escitalopram (LEXAPRO) 10 MG tablet Take 1 tablet (10 mg total) by mouth daily. 01/15/23   Storm Frisk, MD  fluticasone Aleda Grana) 50 MCG/ACT nasal spray 1-2 sprays in each nostril 01/15/23   Storm Frisk, MD  fluticasone [facility] HFA) 110 MCG/ACT inhaler 2 puffs 01/15/23   Storm Frisk, MD  ipratropium-albuterol (DUONEB) 0.5-2.5 (3) MG/3ML SOLN Take 3 mLs by nebulization every 6 (six) hours as needed. 04/07/22   Storm Frisk, MD  montelukast (SINGULAIR) 10 MG tablet 1 tablet in the evening 01/15/23   Storm Frisk, MD  Olopatadine HCl 0.2 % SOLN 1 drop into affected eye    [provider]  Spacer/Aero-Holding Chambers (AEROCHAMBER PLUS FLO-VU LARGE) MISC See admin instructions.    [provider]    Family History Family History  Problem Relation Age of Onset   Hypertension Mother     Social History Social History   Tobacco Use   Smoking status: Never   Smokeless tobacco: Never  Vaping Use   Vaping Use: Never used  Substance Use Topics   Alcohol use: Never   Drug use: Never     Allergies   Advair diskus [fluticasone-salmeterol], Chocolate,  and Penicillins   Review of Systems Review of Systems  Constitutional: Negative.   HENT:  Positive for congestion, rhinorrhea and sore throat. Negative for dental problem, drooling, ear discharge, ear pain, facial swelling, hearing loss, mouth sores, nosebleeds, postnasal drip, sinus pressure, sinus pain, sneezing, tinnitus, trouble swallowing and voice change.   Respiratory:  Positive for cough and wheezing. Negative for apnea, choking, chest tightness, shortness of breath and stridor.   Cardiovascular: Negative.      Physical Exam Triage Vital Signs ED Triage  Vitals [04/13/23 0823]  Enc Vitals Group     BP 133/67     Pulse Rate 94     Resp 18     Temp 98.3 F (36.8 C)     Temp Source Oral     SpO2 97 %     Weight      Height      Head Circumference      Peak Flow      Pain Score 0     Pain Loc      Pain Edu?      Excl. in GC?    No data found.  Updated Vital Signs BP 133/67 (BP Location: Right Arm)   Pulse 94   Temp 98.3 F (36.8 C) (Oral)   Resp 18   SpO2 97%   Visual Acuity Right Eye Distance:   Left Eye Distance:   Bilateral Distance:    Right Eye Near:   Left Eye Near:    Bilateral Near:     Physical Exam HENT:     Head: Normocephalic.     Right Ear: Tympanic membrane, ear canal and external ear normal.     Left Ear: Tympanic membrane, ear canal and external ear normal.     Nose: Congestion and rhinorrhea present.     Mouth/Throat:     Mouth: Mucous membranes are moist.     Pharynx: Oropharynx is clear.  Eyes:     Extraocular Movements: Extraocular movements intact.  Cardiovascular:     Rate and Rhythm: Normal rate and regular rhythm.     Pulses: Normal pulses.     Heart sounds: Normal heart sounds.  Pulmonary:     Effort: Pulmonary effort is normal.     Breath sounds: Normal breath sounds.  Skin:    General: Skin is warm and dry.  Neurological:     Mental Status: He is alert and oriented to person, place, and time. Mental status is at baseline.      UC Treatments / Results  Labs (all labs ordered are listed, but only abnormal results are displayed) Labs Reviewed - No data to display  EKG   Radiology No results found.  Procedures Procedures (including critical care time)  Medications Ordered in UC Medications - No data to display  Initial Impression / Assessment and Plan / UC Course  I have reviewed the triage vital signs and the nursing notes.  Pertinent labs & imaging results that were available during my care of the patient were reviewed by me and considered in my medical decision  making (see chart for details).  Viral URI with cough  Patient is in no signs of distress nor toxic appearing.  Vital signs are stable.  Low suspicion for pneumonia, pneumothorax or bronchitis and therefore will defer imaging.  Viral testing deferred due to timeline of illness.  Prescribed prednisone, Tessalon and Promethazine DM.May use additional over-the-counter medications as needed for supportive care.  May follow-up with urgent  care as needed if symptoms persist or worsen.  Note given.   Final Clinical Impressions(s) / UC Diagnoses   Final diagnoses:  None   Discharge Instructions   None    ED Prescriptions   None    PDMP not reviewed this encounter.   Valinda Hoar, [place] 04/13/23 [phone]

## 2023-04-13 NOTE — ED Triage Notes (Signed)
Pt c/o uncontrollable cough and congestion x1wk. States taking OTC meds with no relief.

## 2023-04-13 NOTE — Discharge Instructions (Signed)
Your symptoms today are most likely being caused by a virus and should steadily improve in time it can take up to 7 to 10 days before you truly start to see a turnaround however things will get better  Begin prednisone every morning with food for 5 days to open and relax the airway, this should help to settle the harshness of the cough as well as wheezing  You may use Tessalon pill every 8 hours as needed to help calm your coughing  You may use cough syrup at bedtime as needed for additional comfort, be mindful of this will make you drowsy  Nebulizer treatments has been refilled    You can take Tylenol and/or Ibuprofen as needed for fever reduction and pain relief.   For cough: honey 1/2 to 1 teaspoon (you can dilute the honey in water or another fluid).  You can also use guaifenesin and dextromethorphan for cough. You can use a humidifier for chest congestion and cough.  If you don't have a humidifier, you can sit in the bathroom with the hot shower running.      For sore throat: try warm salt water gargles, cepacol lozenges, throat spray, warm tea or water with lemon/honey, popsicles or ice, or OTC cold relief medicine for throat discomfort.   For congestion: take a daily anti-histamine like Zyrtec, Claritin, and a oral decongestant, such as pseudoephedrine.  You can also use Flonase 1-2 sprays in each nostril daily.   It is important to stay hydrated: drink plenty of fluids (water, gatorade/powerade/pedialyte, juices, or teas) to keep your throat moisturized and help further relieve irritation/discomfort.

## 2023-05-05 ENCOUNTER — Other Ambulatory Visit: Payer: Self-pay | Admitting: Critical Care Medicine

## 2023-05-05 DIAGNOSIS — J309 Allergic rhinitis, unspecified: Secondary | ICD-10-CM

## 2023-06-15 NOTE — Progress Notes (Unsigned)
New Patient Office Visit  Subjective    Patient ID: Jesse Rhodes, male    DOB: 2001/05/05  Age: 22 y.o. MRN: [id number]  CC:  No chief complaint on file.   HPI 04/07/22 Jesse Rhodes presents to establish care This is a 22 year old male seen to establish primary care he is accompanied by his mother whose name is [name].  Note he scores high on suicidal ideation.  He does not have a plan specifically.  Also history of COVID illness December 25 with right upper lobe pneumonia.  He was treated with antibiotics and supportive care.  Patient does have severe persistent asthma with significant atopy.  He is followed by [facility].  Patient also follows at [facility] for his mental health conditions which include general anxiety and depression.  He still having a mild cough at this time.  He has no other complaints.  He tends to stay at home a lot and play video games.  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  He has no other complaints at this time.  9/7 Patient is seen again in return follow-up from a visit in May overall doing well but has had foot pain right greater than left for the past week.  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  He is now on the Lexapro 10 mg daily and he no longer has suicidal ideation mental health is improved.  He does agree to and did receive the flu vaccine.  He would like an ophthalmology referral as he has had change in his vision.  He has follow-up with Vesta Mixer who manages his mental health condition.  On arrival blood pressure is 112/73  01/15/23 This patient is seen in return follow-up accompanied by his father he overall has been doing very well he no longer has evidence of suicidality.  Asthma has been stable.  He no longer has a dog in the house.  He does have some postnasal drainage and cough this productive of yellow mucus and brown mucus coming out of the sinuses.  He has been compliant with all his inhaled medications.  His  weight is gone up with a BMI of 27.5.  He does have a mildly elevated cholesterol and has been trying dietary change is not physically active watches video games all day  06/16/23  Outpatient Encounter Medications as of 06/16/2023  Medication Sig   albuterol (VENTOLIN HFA) 108 (90 Base) MCG/ACT inhaler Inhale 2 puffs into the lungs every 6 (six) hours as needed. For wheeze or shortness of breath   azelastine (ASTELIN) 0.1 % nasal spray Place 2 sprays into both nostrils 2 (two) times daily. Use in each nostril as directed   benzonatate (TESSALON) 100 MG capsule Take 1 capsule (100 mg total) by mouth every 8 (eight) hours.   cetirizine (ZYRTEC) 10 MG tablet TAKE 1 TABLET(10 MG) BY MOUTH AT BEDTIME   EPINEPHrine 0.3 mg/0.3 mL IJ SOAJ injection See admin instructions.   escitalopram (LEXAPRO) 10 MG tablet Take 1 tablet (10 mg total) by mouth daily.   fluticasone (FLONASE) 50 MCG/ACT nasal spray 1-2 sprays in each nostril   fluticasone (FLOVENT HFA) 110 MCG/ACT inhaler 2 puffs   ipratropium-albuterol (DUONEB) 0.5-2.5 (3) MG/3ML SOLN Take 3 mLs by nebulization every 6 (six) hours as needed.   montelukast (SINGULAIR) 10 MG tablet 1 tablet in the evening   Olopatadine HCl 0.2 % SOLN 1 drop into affected eye   predniSONE (DELTASONE)  20 MG tablet Take 2 tablets (40 mg total) by mouth daily.   promethazine-dextromethorphan (PROMETHAZINE-DM) 6.25-15 MG/5ML syrup Take 5 mLs by mouth at bedtime as needed for cough.   Spacer/Aero-Holding Chambers (AEROCHAMBER PLUS FLO-VU LARGE) MISC See admin instructions.   No facility-administered encounter medications on file as of 06/16/2023.    Past Medical History:  Diagnosis Date   ADHD (attention deficit hyperactivity disorder)    Allergy    Asthma    Autism    Constipation     Past Surgical History:  Procedure Laterality Date   ADENOIDECTOMY      Family History  Problem Relation Age of Onset   Hypertension Mother     Social History   Socioeconomic  History   Marital status: Single    Spouse name: Not on file   Number of children: Not on file   Years of education: Not on file   Highest education level: 12th grade  Occupational History   Occupation: sales  Tobacco Use   Smoking status: Never   Smokeless tobacco: Never  Vaping Use   Vaping Use: Never used  Substance and Sexual Activity   Alcohol use: Never   Drug use: Never   Sexual activity: Never  Other Topics Concern   Not on file  Social History Narrative   Not on file   Social Determinants of Health   Financial Resource Strain: Low Risk  (06/14/2023)   Overall Financial Resource Strain (CARDIA)    Difficulty of Paying Living Expenses: Not hard at all  Food Insecurity: No Food Insecurity (06/14/2023)   Hunger Vital Sign    Worried About Running Out of Food in the Last Year: Never true    Ran Out of Food in the Last Year: Never true  Transportation Needs: No Transportation Needs (06/14/2023)   PRAPARE - Administrator, Civil Service (Medical): No    Lack of Transportation (Non-Medical): No  Physical Activity: Sufficiently Active (06/14/2023)   Exercise Vital Sign    Days of Exercise per Week: 3 days    Minutes of Exercise per Session: 60 min  Stress: No Stress Concern Present (06/14/2023)   [employer] of Occupational Health - Occupational Stress Questionnaire    Feeling of Stress : Not at all  Social Connections: Moderately Isolated (06/14/2023)   Social Connection and Isolation Panel [NHANES]    Frequency of Communication with Friends and Family: Three times a week    Frequency of Social Gatherings with Friends and Family: Twice a week    Attends Religious Services: 1 to 4 times per year    Active Member of [employer] or Organizations: No    Attends Engineer, structural: Not on file    Marital Status: Never married  Intimate Partner Violence: Not on file    Review of Systems  Constitutional:  Negative for chills, diaphoresis, fever,  malaise/fatigue and weight loss.  HENT:  Positive for congestion, sinus pain and sore throat. Negative for ear discharge, ear pain, hearing loss, nosebleeds and tinnitus.   Eyes: Negative.  Negative for blurred vision, photophobia and redness.  Respiratory:  Positive for cough. Negative for hemoptysis, sputum production, shortness of breath, wheezing and stridor.   Cardiovascular:  Negative for chest pain, palpitations, orthopnea, claudication, leg swelling and PND.  Gastrointestinal: Negative.  Negative for abdominal pain, blood in stool, constipation, diarrhea, heartburn, nausea and vomiting.  Genitourinary: Negative.  Negative for dysuria, flank pain, frequency, hematuria and urgency.  Musculoskeletal:  Negative for  back pain, falls, joint pain, myalgias and neck pain.       Foot pain  Skin:  Negative for itching and rash.  Neurological: Negative.  Negative for dizziness, tingling, tremors, sensory change, speech change, focal weakness, seizures, loss of consciousness, weakness and headaches.  Endo/Heme/Allergies:  Negative for environmental allergies and polydipsia. Does not bruise/bleed easily.  Psychiatric/Behavioral:  Negative for depression, memory loss, substance abuse and suicidal ideas. The patient is not nervous/anxious and does not have insomnia.         Objective    There were no vitals taken for this visit.  Physical Exam Vitals reviewed.  Constitutional:      Appearance: Normal appearance. He is well-developed. He is obese. He is not diaphoretic.  HENT:     Head: Normocephalic and atraumatic.     Nose: Congestion and rhinorrhea present. No nasal deformity, septal deviation or mucosal edema.     Right Sinus: No maxillary sinus tenderness or frontal sinus tenderness.     Left Sinus: No maxillary sinus tenderness or frontal sinus tenderness.     Comments: Bilateral nasal purulence    Mouth/Throat:     Mouth: Mucous membranes are moist.     Pharynx: Oropharynx is clear.  No oropharyngeal exudate.     Comments: Good dentition Eyes:     General: No scleral icterus.    Conjunctiva/sclera: Conjunctivae normal.     Pupils: Pupils are equal, round, and reactive to light.  Neck:     Thyroid: No thyromegaly.     Vascular: No carotid bruit or JVD.     Trachea: Trachea normal. No tracheal tenderness or tracheal deviation.  Cardiovascular:     Rate and Rhythm: Normal rate and regular rhythm.     Chest Wall: PMI is not displaced.     Pulses: Normal pulses. No decreased pulses.     Heart sounds: Normal heart sounds, S1 normal and S2 normal. Heart sounds not distant. No murmur heard.    No systolic murmur is present.     No diastolic murmur is present.     No friction rub. No gallop. No S3 or S4 sounds.  Pulmonary:     Effort: No tachypnea, accessory muscle usage or respiratory distress.     Breath sounds: No stridor. No decreased breath sounds, wheezing, rhonchi or rales.  Chest:     Chest wall: No tenderness.  Abdominal:     General: Bowel sounds are normal. There is no distension.     Palpations: Abdomen is soft. Abdomen is not rigid.     Tenderness: There is no abdominal tenderness. There is no guarding or rebound.  Musculoskeletal:        General: No tenderness. Normal range of motion.     Cervical back: Normal range of motion and neck supple. No edema, erythema or rigidity. No muscular tenderness. Normal range of motion.  Lymphadenopathy:     Head:     Right side of head: No submental or submandibular adenopathy.     Left side of head: No submental or submandibular adenopathy.     Cervical: No cervical adenopathy.  Skin:    General: Skin is warm and dry.     Coloration: Skin is not pale.     Findings: No rash.     Nails: There is no clubbing.  Neurological:     Mental Status: He is alert and oriented to person, place, and time. Mental status is at baseline.     Sensory: No  sensory deficit.  Psychiatric:        Attention and Perception: Attention  and perception normal.        Mood and Affect: Mood and affect normal. Mood is not depressed. Affect is not flat.        Speech: Speech normal.        Behavior: Behavior normal. Behavior is cooperative.        Thought Content: Thought content normal. Thought content is not paranoid or delusional. Thought content does not include homicidal or suicidal ideation. Thought content does not include homicidal or suicidal plan.        Cognition and Memory: Cognition and memory normal.        Judgment: Judgment normal.         Assessment & Plan:   Problem List Items Addressed This Visit   None No follow-ups on file.   Shan Levans, MD

## 2023-06-16 ENCOUNTER — Encounter: Payer: Self-pay | Admitting: Critical Care Medicine

## 2023-06-16 ENCOUNTER — Ambulatory Visit: Payer: Medicare Other | Attending: Critical Care Medicine | Admitting: Critical Care Medicine

## 2023-06-16 VITALS — BP 123/78 | HR 94 | Wt 175.6 lb

## 2023-06-16 DIAGNOSIS — J4550 Severe persistent asthma, uncomplicated: Secondary | ICD-10-CM | POA: Diagnosis not present

## 2023-06-16 DIAGNOSIS — F419 Anxiety disorder, unspecified: Secondary | ICD-10-CM | POA: Diagnosis not present

## 2023-06-16 DIAGNOSIS — E782 Mixed hyperlipidemia: Secondary | ICD-10-CM | POA: Diagnosis not present

## 2023-06-16 DIAGNOSIS — F332 Major depressive disorder, recurrent severe without psychotic features: Secondary | ICD-10-CM

## 2023-06-16 DIAGNOSIS — Z8616 Personal history of COVID-19: Secondary | ICD-10-CM | POA: Diagnosis not present

## 2023-06-16 DIAGNOSIS — J4530 Mild persistent asthma, uncomplicated: Secondary | ICD-10-CM | POA: Diagnosis not present

## 2023-06-16 DIAGNOSIS — R45851 Suicidal ideations: Secondary | ICD-10-CM | POA: Diagnosis not present

## 2023-06-16 DIAGNOSIS — F32A Depression, unspecified: Secondary | ICD-10-CM | POA: Insufficient documentation

## 2023-06-16 DIAGNOSIS — Z09 Encounter for follow-up examination after completed treatment for conditions other than malignant neoplasm: Secondary | ICD-10-CM | POA: Diagnosis present

## 2023-06-16 DIAGNOSIS — J0101 Acute recurrent maxillary sinusitis: Secondary | ICD-10-CM

## 2023-06-16 NOTE — Assessment & Plan Note (Signed)
Resolved

## 2023-06-16 NOTE — Assessment & Plan Note (Signed)
Improved with diet control

## 2023-06-16 NOTE — Assessment & Plan Note (Signed)
Stable at this time continue Lexapro

## 2023-06-16 NOTE — Patient Instructions (Signed)
No change in medications Sign up for mychart Return 1 year Continue with lifestyle approach

## 2023-06-16 NOTE — Assessment & Plan Note (Signed)
Stable continue inhalers  

## 2023-09-08 ENCOUNTER — Telehealth: Payer: Self-pay | Admitting: Critical Care Medicine

## 2023-09-10 ENCOUNTER — Encounter: Payer: Medicare Other | Admitting: Family Medicine

## 2023-10-06 ENCOUNTER — Ambulatory Visit: Payer: Medicare Other | Attending: Family Medicine | Admitting: Internal Medicine

## 2023-10-06 ENCOUNTER — Encounter: Payer: Self-pay | Admitting: Internal Medicine

## 2023-10-06 VITALS — BP 112/71 | HR 93 | Temp 98.1°F | Ht 68.0 in | Wt 175.0 lb

## 2023-10-06 DIAGNOSIS — Z7189 Other specified counseling: Secondary | ICD-10-CM | POA: Diagnosis not present

## 2023-10-06 DIAGNOSIS — F99 Mental disorder, not otherwise specified: Secondary | ICD-10-CM | POA: Diagnosis not present

## 2023-10-06 DIAGNOSIS — Z23 Encounter for immunization: Secondary | ICD-10-CM

## 2023-10-06 DIAGNOSIS — Z0000 Encounter for general adult medical examination without abnormal findings: Secondary | ICD-10-CM | POA: Diagnosis not present

## 2023-10-06 DIAGNOSIS — Z2821 Immunization not carried out because of patient refusal: Secondary | ICD-10-CM

## 2023-10-06 DIAGNOSIS — Z28310 Unvaccinated for covid-19: Secondary | ICD-10-CM

## 2023-10-06 NOTE — Progress Notes (Signed)
Subjective:    Jesse Rhodes is a 22 y.o. male who presents for a Welcome to Medicare exam.   Mom, Tomeka Chretien, is with him.  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 No ADHD based on his last pediatrician's eval per his mother.  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  Medications Outpatient Encounter Medications as of 10/06/2023  Medication Sig   albuterol (VENTOLIN HFA) 108 (90 Base) MCG/ACT inhaler Inhale 2 puffs into the lungs every 6 (six) hours as needed. For wheeze or shortness of breath   azelastine (ASTELIN) 0.1 % nasal spray Place 2 sprays into both nostrils 2 (two) times daily. Use in each nostril as directed   cetirizine (ZYRTEC) 10 MG tablet TAKE 1 TABLET(10 MG) BY MOUTH AT BEDTIME   EPINEPHrine 0.3 mg/0.3 mL IJ SOAJ injection See admin instructions.   escitalopram (LEXAPRO) 10 MG tablet Take 1 tablet (10 mg total) by mouth daily.   fluticasone (FLONASE) 50 MCG/ACT nasal spray 1-2 sprays in each nostril   fluticasone (FLOVENT HFA) 110 MCG/ACT inhaler 2 puffs   ipratropium-albuterol (DUONEB) 0.5-2.5 (3) MG/3ML SOLN Take 3 mLs by nebulization every 6 (six) hours as needed.   montelukast (SINGULAIR) 10 MG tablet 1 tablet in the evening   Olopatadine HCl 0.2 % SOLN 1 drop into affected eye   Spacer/Aero-Holding Chambers (AEROCHAMBER PLUS FLO-VU LARGE) MISC See admin instructions.   No facility-administered encounter medications on file as of 10/06/2023.     History: Past Medical History:  Diagnosis Date   ADHD (attention deficit hyperactivity disorder)    Allergy    Anxiety    Asthma    Autism    Constipation     Depression    Past Surgical History:  Procedure Laterality Date   ADENOIDECTOMY      Family History  Problem Relation Age of Onset   Hypertension Mother    Social History   Occupational History   Occupation: Airline pilot  Tobacco Use   Smoking status: Never   Smokeless tobacco: Never  Vaping Use   Vaping status: Never Used  Substance and Sexual Activity   Alcohol use: Never   Drug use: Never   Sexual activity: Never    Tobacco Counseling nonsmoker  Immunizations and Health Maintenance Immunization History  Administered Date(s) Administered   Influenza, High Dose Seasonal PF 11/15/2018   Influenza, Seasonal, Injecte, Preservative Fre 10/06/2023   Influenza,inj,Quad PF,6+ Mos 09/03/2018, 07/23/2019, 08/14/2022   Tdap 09/03/2018   Health Maintenance Due  Topic Date Due   COVID-19 Vaccine (1 - 2023-24 season) Never done  Pt never received COVID-19 vaccine series and both pt and mother declined starting  Activities of Daily Living    10/06/2023   11:04 AM 10/05/2023    2:17 PM  In your present state of health, do you have any difficulty performing the following activities:  Hearing? 0 0  Vision? 0 0  Difficulty concentrating or making decisions? 0 0  Walking or climbing stairs? 0 0  Dressing or bathing? 0 0  Doing errands, shopping? 1 0  Preparing Food and eating ? N N  Using the Toilet? N N  In the past six months, have you accidently leaked urine? N N  Do you have problems with loss of bowel control? N N  Managing your Medications? Y N  Comment Mom is legal guardian and helps manage medications   Managing your Finances? Y N  Comment Mom is legal guardian and helps manage finances   Housekeeping or managing your Housekeeping? N N    Physical Exam   Physical Exam (optional), or other factors deemed appropriate based on the beneficiary's medical and social history and current clinical standards. General: Young African-American male in NAD Mouth: Tongue is  moist. Neck: No cervical or axillary lymphadenopathy Chest: Clear to auscultation bilaterally  CVS: Regular rate and rhythm no gallops or murmurs Extremities: No edema  Advanced Directives: Does Patient Have a Medical Advance Directive?: No Does not have advance directive.  Mom has partial guardianship  EKG:  normal EKG, normal sinus rhythm, unchanged from previous tracings, NA     Assessment:    This is a routine wellness  examination for this patient .   Vision/Hearing screen Vision Screening   Right eye Left eye Both eyes  Without correction     With correction 20/20 20/10 20/10   Hearing Screening - Comments:: Whisper test is normal BL :     Goals      Exercise 2x per week for 1 hr         Depression Screen    10/06/2023   11:08 AM 06/16/2023    9:12 AM 01/15/2023    9:02 AM 08/14/2022    9:29 AM  PHQ 2/9 Scores  PHQ - 2 Score 0 0 0 0  PHQ- 9 Score 1   2     Fall Risk    10/06/2023   11:02 AM  Fall Risk   Falls in the past year? 0  Number falls in past yr: 0  Injury with Fall? 0  Follow up Falls evaluation completed    Cognitive Function    10/06/2023   11:17 AM 10/06/2023   11:08 AM  MMSE - Mini Mental State Exam  Orientation to time 5 5  Orientation to Place 5 5  Registration 2 2  Attention/ Calculation 2 2  Recall 2 2  Language- name 2 objects 2 1  Language- repeat 1 1  Language- follow 3 step command 1 1  Language- read & follow direction 1 1  Write a sentence 1 1  Copy design 1 1  Total score 23 22       Patient Care Team: Marcine Matar, MD as PCP - General (Internal Medicine) Se[facility] at [facility] for med managment-    Plan:   1. Encounter for Medicare annual wellness exam -Patient has set a goal to start doing some aerobic exercise twice a week for 1 hour.  I have encouraged him to get started.  2. Advance directive discussed with patient Mother states that she has partial guardianship.  We discussed advance directive  including living will and healthcare power of attorney.  Given packet today to take home to review.  Advised that if a living will or healthcare power of attorney is/are executed, please bring a copy for our records.  3. COVID-19 vaccine series declined Patient declined COVID-vaccine series  4. Abnormal MMSE Mother feels this is due to him having autism.  5. Encounter for immunization - Flu vaccine trivalent PF,  6mos and older(Flulaval,Afluria,Fluarix,Fluzone)   I have personally reviewed and noted the following in the patient's chart:   Medical and social history Use of alcohol, tobacco or illicit drugs  Current medications and supplements Functional ability and status Nutritional status Physical activity Advanced directives List of other physicians Hospitalizations, surgeries, and ER visits in previous 12 months Vitals Screenings to include cognitive, depression, and falls Referrals and appointments  In addition, I have reviewed and discussed with patient certain preventive protocols, quality metrics, and best practice recommendations. A written personalized care plan for preventive services as well as general preventive health recommendations were provided to patient.     Jonah Blue, MD 10/06/2023

## 2023-10-06 NOTE — Patient Instructions (Signed)
I have given you a copy of our advanced directive packet.  Please take it home and look over it.  If you decide to execute a living will or healthcare power of attorney, please bring a copy for our records on your next visit.

## 2023-12-30 ENCOUNTER — Ambulatory Visit
Admission: RE | Admit: 2023-12-30 | Discharge: 2023-12-30 | Disposition: A | Discharge status: Home / Self Care | Payer: Medicare Other | Source: Ambulatory Visit | Attending: Family Medicine | Admitting: Family Medicine

## 2023-12-30 VITALS — BP 127/84 | HR 96 | Temp 98.4°F | Resp 18

## 2023-12-30 DIAGNOSIS — J988 Other specified respiratory disorders: Secondary | ICD-10-CM | POA: Diagnosis not present

## 2023-12-30 DIAGNOSIS — J4530 Mild persistent asthma, uncomplicated: Secondary | ICD-10-CM | POA: Insufficient documentation

## 2023-12-30 DIAGNOSIS — B9789 Other viral agents as the cause of diseases classified elsewhere: Secondary | ICD-10-CM | POA: Insufficient documentation

## 2023-12-30 DIAGNOSIS — J309 Allergic rhinitis, unspecified: Secondary | ICD-10-CM | POA: Insufficient documentation

## 2023-12-30 MED ORDER — PROMETHAZINE-DM 6.25-15 MG/5ML PO SYRP: 5.0000 mL | ORAL_SOLUTION | Freq: Three times a day (TID) | ORAL | 0 refills | Status: AC | PRN

## 2023-12-30 MED ORDER — EPINEPHRINE 0.3 MG/0.3ML IJ SOAJ: 0.3000 mg | INTRAMUSCULAR | 0 refills | Status: AC

## 2023-12-30 MED ORDER — METHYLPREDNISOLONE 4 MG PO TBPK: ORAL_TABLET | ORAL | 0 refills | Status: DC

## 2023-12-30 NOTE — ED Triage Notes (Signed)
Pt reports nasal congestion, scratchy throat cough x 3-4 days. Mucinex gives some relief.

## 2023-12-30 NOTE — Discharge Instructions (Addendum)
Please start the Medrol dose pack to help you with your sinuses, wheezing and coughing as you recover from the virus infection you have. Use cough syrup as needed.   We will let you know about your COVID test result tomorrow.

## 2023-12-30 NOTE — ED Provider Notes (Signed)
[facility]  Note:  This document was prepared using Conservation officer, historic buildings and may include unintentional dictation errors.  MRN: [id number] DOB: 21-Nov-2001  Subjective:   Jesse Rhodes is a 23 y.o. male presenting for 3 to 4-day history of sinus congestion, sinus pressure, scratchy throat.  Has also had coughing with wheezing.  No fever, sinus pain, facial pain, ear pain, chest pain.  Has a history of significant allergies, food allergies, asthma.  Needs a refill of his EpiPen.  Does not need refill of his albuterol medications.  No smoking of any kind including cigarettes, cigars, vaping, marijuana use.  Wants a COVID test.  No current facility-administered medications for this encounter.  Current Outpatient Medications:    albuterol (VENTOLIN HFA) 108 (90 Base) MCG/ACT inhaler, Inhale 2 puffs into the lungs every 6 (six) hours as needed. For wheeze or shortness of breath, Disp: 18 g, Rfl: 1   azelastine (ASTELIN) 0.1 % nasal spray, Place 2 sprays into both nostrils 2 (two) times daily. Use in each nostril as directed, Disp: 30 mL, Rfl: 12   cetirizine (ZYRTEC) 10 MG tablet, TAKE 1 TABLET(10 MG) BY MOUTH AT BEDTIME, Disp: 90 tablet, Rfl: 0   EPINEPHrine 0.3 mg/0.3 mL IJ SOAJ injection, See admin instructions., Disp: , Rfl:    escitalopram (LEXAPRO) 10 MG tablet, Take 1 tablet (10 mg total) by mouth daily., Disp: 30 tablet, Rfl: 5   fluticasone (FLONASE) 50 MCG/ACT nasal spray, 1-2 sprays in each nostril, Disp: 16 g, Rfl: 3   fluticasone (FLOVENT HFA) 110 MCG/ACT inhaler, 2 puffs, Disp: 1 each, Rfl: 6   ipratropium-albuterol (DUONEB) 0.5-2.5 (3) MG/3ML SOLN, Take 3 mLs by nebulization every 6 (six) hours as needed., Disp: 360 mL, Rfl: 0   montelukast (SINGULAIR) 10 MG tablet, 1 tablet in the evening, Disp: 60 tablet, Rfl: 2   Olopatadine HCl 0.2 % SOLN, 1 drop into affected eye, Disp: , Rfl:    Spacer/Aero-Holding Chambers (AEROCHAMBER PLUS FLO-VU LARGE)  MISC, See admin instructions., Disp: , Rfl:    Allergies  Allergen Reactions   Advair Diskus [Fluticasone-Salmeterol] Rash   Chocolate Hives, Itching and Rash   Penicillins Rash    Past Medical History:  Diagnosis Date   ADHD (attention deficit hyperactivity disorder)    Allergy    Anxiety    Asthma    Autism    Constipation    Depression      Past Surgical History:  Procedure Laterality Date   ADENOIDECTOMY      Family History  Problem Relation Age of Onset   Hypertension Mother     Social History   Tobacco Use   Smoking status: Never   Smokeless tobacco: Never  Vaping Use   Vaping status: Never Used  Substance Use Topics   Alcohol use: Never   Drug use: Never    ROS   Objective:   Vitals: BP 127/84 (BP Location: Right Arm)   Pulse 96   Temp 98.4 F (36.9 C) (Oral)   Resp 18   SpO2 97%   Physical Exam Constitutional:      General: He is not in acute distress.    Appearance: Normal appearance. He is well-developed and normal weight. He is not ill-appearing, toxic-appearing or diaphoretic.  HENT:     Head: Normocephalic and atraumatic.     Right Ear: Tympanic membrane, ear canal and external ear normal. No drainage, swelling or tenderness. No middle ear effusion. There is no impacted  cerumen. Tympanic membrane is not erythematous or bulging.     Left Ear: Tympanic membrane, ear canal and external ear normal. No drainage, swelling or tenderness.  No middle ear effusion. There is no impacted cerumen. Tympanic membrane is not erythematous or bulging.     Nose: Congestion and rhinorrhea present.     Mouth/Throat:     Mouth: Mucous membranes are moist.     Pharynx: No oropharyngeal exudate or posterior oropharyngeal erythema.  Eyes:     General: No scleral icterus.       Right eye: No discharge.        Left eye: No discharge.     Extraocular Movements: Extraocular movements intact.     Conjunctiva/sclera: Conjunctivae normal.  Cardiovascular:      Rate and Rhythm: Normal rate and regular rhythm.     Heart sounds: Normal heart sounds. No murmur heard.    No friction rub. No gallop.  Pulmonary:     Effort: Pulmonary effort is normal. No respiratory distress.     Breath sounds: Normal breath sounds. No stridor. No wheezing, rhonchi or rales.  Musculoskeletal:     Cervical back: Normal range of motion and neck supple. No rigidity. No muscular tenderness.  Neurological:     General: No focal deficit present.     Mental Status: He is alert and oriented to person, place, and time.  Psychiatric:        Mood and Affect: Mood normal.        Behavior: Behavior normal.        Thought Content: Thought content normal.     Assessment and Plan :   PDMP not reviewed this encounter.  1. Viral respiratory infection   2. Allergic rhinitis, unspecified seasonality, unspecified trigger   3. Mild persistent asthma, uncomplicated    Suspect a viral respiratory infection.  Recommend supportive care.  COVID testing pending.  Given his significant sinus symptoms and in the context of his allergies and asthma offered a Medrol Dosepak.  Refilled his EpiPen.  Counseled patient on potential for adverse effects with medications prescribed/recommended today, ER and return-to-clinic precautions discussed, patient verbalized understanding.    Wallis Bamberg, PA-C 12/30/23 1311

## 2023-12-31 LAB — SARS CORONAVIRUS 2 (TAT 6-24 HRS): SARS Coronavirus 2: NEGATIVE

## 2024-01-21 ENCOUNTER — Telehealth: Payer: Self-pay | Admitting: Internal Medicine

## 2024-01-21 NOTE — Telephone Encounter (Signed)
On January 21, 2024, at 9:03 am, I called the patient and verified their identity by confirming their date of birth. I informed them that their appointment with Dr. Laural Benes on March 07, 2024, needed to be rescheduled, as Dr. Laural Benes will not be in the office that day. Patient appointment has been rescheduled.

## 2024-03-07 ENCOUNTER — Ambulatory Visit: Payer: Medicare Other | Admitting: Internal Medicine

## 2024-03-15 ENCOUNTER — Ambulatory Visit: Payer: Medicare Other | Attending: Internal Medicine | Admitting: Internal Medicine

## 2024-03-15 ENCOUNTER — Encounter: Payer: Self-pay | Admitting: Internal Medicine

## 2024-03-15 VITALS — BP 118/74 | HR 83 | Temp 97.3°F | Ht 68.0 in | Wt 172.0 lb

## 2024-03-15 DIAGNOSIS — E663 Overweight: Secondary | ICD-10-CM | POA: Diagnosis not present

## 2024-03-15 DIAGNOSIS — J4530 Mild persistent asthma, uncomplicated: Secondary | ICD-10-CM | POA: Insufficient documentation

## 2024-03-15 DIAGNOSIS — Z760 Encounter for issue of repeat prescription: Secondary | ICD-10-CM | POA: Diagnosis not present

## 2024-03-15 DIAGNOSIS — Z7182 Exercise counseling: Secondary | ICD-10-CM | POA: Insufficient documentation

## 2024-03-15 DIAGNOSIS — F840 Autistic disorder: Secondary | ICD-10-CM | POA: Diagnosis not present

## 2024-03-15 DIAGNOSIS — Z6826 Body mass index (BMI) 26.0-26.9, adult: Secondary | ICD-10-CM | POA: Diagnosis not present

## 2024-03-15 DIAGNOSIS — F411 Generalized anxiety disorder: Secondary | ICD-10-CM | POA: Diagnosis not present

## 2024-03-15 DIAGNOSIS — Z79899 Other long term (current) drug therapy: Secondary | ICD-10-CM | POA: Diagnosis not present

## 2024-03-15 DIAGNOSIS — Z23 Encounter for immunization: Secondary | ICD-10-CM

## 2024-03-15 DIAGNOSIS — E782 Mixed hyperlipidemia: Secondary | ICD-10-CM | POA: Diagnosis present

## 2024-03-15 DIAGNOSIS — F32A Depression, unspecified: Secondary | ICD-10-CM | POA: Insufficient documentation

## 2024-03-15 MED ORDER — FLUTICASONE PROPIONATE HFA 110 MCG/ACT IN AERO
INHALATION_SPRAY | RESPIRATORY_TRACT | 6 refills | Status: AC
Start: 2024-03-15 — End: ?

## 2024-03-15 NOTE — Patient Instructions (Signed)

## 2024-03-15 NOTE — Progress Notes (Signed)
 Patient ID: Jesse Rhodes, male    DOB: 2001-07-06  MRN: [id number]  CC: Follow-up (Follow-up./Requesting routine blood work Valentino Hue to pneumonia vax)   Subjective: Jesse Rhodes is a 23 y.o. male who presents for chronic ds management. Mother Anell Barr is with him.  His concerns today include:  Jesse Rhodes, is with him.  Pt has high functioning autism (dx at age 53) and speech impediment. No ADHD based on his last pediatrician's eval per his mother. Hx of asthma and MDD/GAD followed by Vesta Mixer, [facility]   Discussed the use of AI scribe software for clinical note transcription with the patient, who gave verbal consent to proceed.  History of Present Illness   Jesse Rhodes, a patient with a history of asthma, high cholesterol, and depression, presents for a routine follow-up. He reports no major health issues since his last visit in October. He has not had any significant asthma flares but was seen in urgent care in January of this year for upper respiratory infection.   He is currently on Flovent inhaler, Astelin nasal spray, and montelukast (Singulair), but no longer takes Zyrtec. He last used his nebulizer in January during an episode of an upper respiratory viral infection, for which he was prescribed a prednisone pack. He tested negative for COVID-19 at that time.  HL/overweight: In terms of his high cholesterol, he has been making dietary changes, including reducing junk food intake and increasing consumption of fruits and vegetables. He admits to drinking apple juice and Sprite, but also reports a high intake of water. He is attempting to cut back on sugary drinks. Exercise is not regular, but he expresses a willingness to increase physical activity with the onset of spring.  His depression is well-managed under the care of [facility], on Lexapro 10mg  as his only medication for this condition. He is seen every three months at [facility].       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 For wheeze or shortness of breath 18 g 1   azelastine (ASTELIN) 0.1 % nasal spray Place 2 sprays into both nostrils 2 (two) times daily. Use in each nostril as directed 30 mL 12   EPINEPHrine 0.3 mg/0.3 mL IJ SOAJ injection Inject 0.3 mg into the muscle See admin instructions. 1 each 0   escitalopram (LEXAPRO) 10 MG tablet Take 1 tablet (10 mg total) by mouth daily. 30 tablet 5   fluticasone (FLONASE) 50 MCG/ACT nasal spray 1-2 sprays in each nostril 16 g 3   ipratropium-albuterol (DUONEB) 0.5-2.5 (3) MG/3ML SOLN Take 3 mLs by nebulization every 6 (six) hours as needed. 360 mL 0   montelukast (SINGULAIR) 10 MG tablet 1 tablet in the evening 60 tablet 2   Olopatadine HCl 0.2 % SOLN 1 drop into affected eye  Spacer/Aero-Holding Chambers (AEROCHAMBER PLUS FLO-VU LARGE) MISC See admin instructions.     promethazine-dextromethorphan (PROMETHAZINE-DM) 6.25-15 MG/5ML syrup Take 5 mLs by mouth 3 (three) times daily as needed for cough. (Patient not taking: Reported on 03/15/2024) 200 mL 0   No current facility-administered medications on file prior to visit.    Allergies  Allergen Reactions   Advair Diskus [Fluticasone-Salmeterol] Rash   Chocolate Hives, Itching and Rash    Penicillins Rash    Social History   Socioeconomic History   Marital status: Single    Spouse name: Not on file   Number of children: Not on file   Years of education: Not on file   Highest education level: 12th grade  Occupational History   Occupation: sales  Tobacco Use   Smoking status: Never   Smokeless tobacco: Never  Vaping Use   Vaping status: Never Used  Substance and Sexual Activity   Alcohol use: Never   Drug use: Never   Sexual activity: Never  Other Topics Concern   Not on file  Social History Narrative   Not on file   Social Drivers of Health   Financial Resource Strain: Low Risk  (10/06/2023)   Overall Financial Resource Strain (CARDIA)    Difficulty of Paying Living Expenses: Not hard at all  Food Insecurity: No Food Insecurity (10/06/2023)   Hunger Vital Sign    Worried About Running Out of Food in the Last Year: Never true    Ran Out of Food in the Last Year: Never true  Transportation Needs: No Transportation Needs (10/06/2023)   PRAPARE - Administrator, Civil Service (Medical): No    Lack of Transportation (Non-Medical): No  Physical Activity: Insufficiently Active (10/06/2023)   Exercise Vital Sign    Days of Exercise per Week: 2 days    Minutes of Exercise per Session: 60 min  Stress: No Stress Concern Present (10/06/2023)   [employer] of Occupational Health - Occupational Stress Questionnaire    Feeling of Stress : Not at all  Social Connections: Moderately Isolated (10/06/2023)   Social Connection and Isolation Panel [NHANES]    Frequency of Communication with Friends and Family: Twice a week    Frequency of Social Gatherings with Friends and Family: Twice a week    Attends Religious Services: More than 4 times per year    Active Member of [employer] or Organizations: No    Attends Banker Meetings: Never    Marital Status: Never married  Intimate Partner Violence: Not At Risk (10/06/2023)   Humiliation,  Afraid, Rape, and Kick questionnaire    Fear of Current or Ex-Partner: No    Emotionally Abused: No    Physically Abused: No    Sexually Abused: No    Family History  Problem Relation Age of Onset   Hypertension Mother     Past Surgical History:  Procedure Laterality Date   ADENOIDECTOMY      ROS: Review of Systems Negative except as stated above  PHYSICAL EXAM: BP 118/74   Pulse 83   Temp (!) 97.3 F (36.3 C) (Oral)   Ht 5\' 8"  (1.727 m)   Wt 172 lb (78 kg)   SpO2 98%   BMI 26.15 kg/m   Wt Readings from Last 3 Encounters:  03/15/24 172 lb (78 kg)  10/06/23 175 lb (79.4 kg)  06/16/23 175 lb 9.6 oz (79.7 kg)    Physical Exam  General appearance - alert, well appearing, and in no  distress Mental status - normal mood, behavior, speech, dress, motor activity, and thought processes Neck - supple, no significant adenopathy Chest - clear to auscultation, no wheezes, rales or rhonchi, symmetric air entry Heart - normal rate, regular rhythm, normal S1, S2, no murmurs, rubs, clicks or gallops Extremities - peripheral pulses normal, no pedal edema, no clubbing or cyanosis      Latest Ref Rng & Units 04/07/2022    9:51 AM 11/29/2018    9:27 AM 09/03/2018   10:15 AM  CMP  Glucose 70 - 99 mg/dL 79  83  69   BUN 6 - 20 mg/dL 14  14  16    Creatinine 0.76 - 1.27 mg/dL 2.13  0.86  5.78   Sodium 134 - 144 mmol/L 140  141  143   Potassium 3.5 - 5.2 mmol/L 4.5  4.5  4.2   Chloride 96 - 106 mmol/L 104  104  102   CO2 20 - 29 mmol/L 24  23  24    Calcium 8.7 - 10.2 mg/dL 9.8  9.4  9.6   Total Protein 6.0 - 8.5 g/dL 6.7  6.5  6.7   Total Bilirubin 0.0 - 1.2 mg/dL 0.4  0.5  0.6   Alkaline Phos 44 - 121 IU/L 50  56  64   AST 0 - 40 IU/L 22  18  19    ALT 0 - 44 IU/L 29  17  23     Lipid Panel     Component Value Date/Time   CHOL 177 04/07/2022 0951   TRIG 203 (H) 04/07/2022 0951   HDL 28 (L) 04/07/2022 0951   CHOLHDL 6.3 (H) 04/07/2022 0951   LDLCALC 113 (H) 04/07/2022 0951     CBC    Component Value Date/Time   WBC 4.4 04/07/2022 0951   WBC 4.2 (L) 02/05/2010 0029   RBC 5.82 (H) 04/07/2022 0951   RBC 4.26 02/05/2010 0029   HGB 17.1 04/07/2022 0951   HCT 49.1 04/07/2022 0951   PLT 227 04/07/2022 0951   MCV 84 04/07/2022 0951   MCH 29.4 04/07/2022 0951   MCHC 34.8 04/07/2022 0951   MCHC 35.7 02/05/2010 0029   RDW 13.2 04/07/2022 0951   LYMPHSABS 2.1 04/07/2022 0951   MONOABS 0.6 02/05/2010 0029   EOSABS 0.2 04/07/2022 0951   BASOSABS 0.0 04/07/2022 0951    ASSESSMENT AND PLAN: 1. Mild persistent asthma, uncomplicated (Primary) Stable.  Continue Flovent, Singulair and DuoNebs with the latter to be used only as needed - fluticasone (FLOVENT HFA) 110 MCG/ACT inhaler; 2 puffs  Dispense: 1 each; Refill: 6  2. Mixed hyperlipidemia See #4 below. - CBC - Comprehensive metabolic panel with GFR - Lipid panel  3. Depressive disorder in remission Stable on Lexapro and followed by [facility]  4. Overweight (BMI 25.0-29.9) Dietary counseling given. Try to cut back  Encouraged him to move more.  Try to walk 3 to 5 days a week for 30 minutes. - Comprehensive metabolic panel with GFR  5. Need for Streptococcus pneumoniae vaccination - Pneumococcal conjugate vaccine 20-valent   Patient was given the opportunity to ask questions.  Patient verbalized understanding of the plan and was able to repeat key elements of the plan.   This documentation was completed using Paediatric nurse.  Any transcriptional errors are unintentional.  Orders Placed This Encounter  Procedures   Pneumococcal conjugate vaccine 20-valent   CBC   Comprehensive metabolic panel with GFR   Lipid panel  Requested Prescriptions   Signed Prescriptions Disp Refills   fluticasone (FLOVENT HFA) 110 MCG/ACT inhaler 1 each 6    Sig: 2 puffs    Return in about 6 months (around 09/14/2024).  Jonah Blue, MD, FACP

## 2024-03-16 ENCOUNTER — Encounter: Payer: Self-pay | Admitting: Internal Medicine

## 2024-03-16 LAB — COMPREHENSIVE METABOLIC PANEL WITH GFR
ALT: 30 IU/L (ref 0–44)
AST: 22 IU/L (ref 0–40)
Albumin: 4.8 g/dL (ref 4.3–5.2)
Alkaline Phosphatase: 48 IU/L (ref 44–121)
BUN/Creatinine Ratio: 14 (ref 9–20)
BUN: 12 mg/dL (ref 6–20)
Bilirubin Total: 0.3 mg/dL (ref 0.0–1.2)
CO2: 23 mmol/L (ref 20–29)
Calcium: 9.6 mg/dL (ref 8.7–10.2)
Chloride: 103 mmol/L (ref 96–106)
Creatinine, Ser: 0.87 mg/dL (ref 0.76–1.27)
Globulin, Total: 2.1 g/dL (ref 1.5–4.5)
Glucose: 77 mg/dL (ref 70–99)
Potassium: 4.3 mmol/L (ref 3.5–5.2)
Sodium: 142 mmol/L (ref 134–144)
Total Protein: 6.9 g/dL (ref 6.0–8.5)
eGFR: 124 mL/min/{1.73_m2} (ref >59)

## 2024-03-16 LAB — LIPID PANEL
Chol/HDL Ratio: 5.8 ratio — ABNORMAL HIGH (ref 0.0–5.0)
Cholesterol, Total: 214 mg/dL — ABNORMAL HIGH (ref 100–199)
HDL: 37 mg/dL — ABNORMAL LOW (ref >39)
LDL Chol Calc (NIH): 152 mg/dL — ABNORMAL HIGH (ref 0–99)
Triglycerides: 138 mg/dL (ref 0–149)
VLDL Cholesterol Cal: 25 mg/dL (ref 5–40)

## 2024-03-16 LAB — CBC
Hematocrit: 48.4 % (ref 37.5–51.0)
Hemoglobin: 16.5 g/dL (ref 13.0–17.7)
MCH: 30.1 pg (ref 26.6–33.0)
MCHC: 34.1 g/dL (ref 31.5–35.7)
MCV: 88 fL (ref 79–97)
Platelets: 203 10*3/uL (ref 150–450)
RBC: 5.49 x10E6/uL (ref 4.14–5.80)
RDW: 13.5 % (ref 11.6–15.4)
WBC: 4.2 10*3/uL (ref 3.4–10.8)

## 2024-06-02 ENCOUNTER — Ambulatory Visit: Payer: Self-pay

## 2024-06-02 ENCOUNTER — Ambulatory Visit
Admission: EM | Admit: 2024-06-02 | Discharge: 2024-06-02 | Disposition: A | Discharge status: Home / Self Care | Attending: Family Medicine | Admitting: Family Medicine

## 2024-06-02 ENCOUNTER — Ambulatory Visit: Attending: Internal Medicine | Admitting: Internal Medicine

## 2024-06-02 ENCOUNTER — Encounter: Payer: Self-pay | Admitting: Internal Medicine

## 2024-06-02 ENCOUNTER — Encounter: Payer: Self-pay | Admitting: Physician Assistant

## 2024-06-02 VITALS — BP 114/72 | HR 75 | Temp 97.6°F | Ht 68.0 in | Wt 167.0 lb

## 2024-06-02 DIAGNOSIS — F840 Autistic disorder: Secondary | ICD-10-CM | POA: Diagnosis not present

## 2024-06-02 DIAGNOSIS — J4530 Mild persistent asthma, uncomplicated: Secondary | ICD-10-CM | POA: Diagnosis not present

## 2024-06-02 DIAGNOSIS — K921 Melena: Secondary | ICD-10-CM

## 2024-06-02 DIAGNOSIS — Z8659 Personal history of other mental and behavioral disorders: Secondary | ICD-10-CM | POA: Diagnosis not present

## 2024-06-02 DIAGNOSIS — Z79899 Other long term (current) drug therapy: Secondary | ICD-10-CM | POA: Insufficient documentation

## 2024-06-02 DIAGNOSIS — F411 Generalized anxiety disorder: Secondary | ICD-10-CM | POA: Insufficient documentation

## 2024-06-02 NOTE — ED Triage Notes (Signed)
 Pt reports blood in stools and in the toilet paper x 1 week. Denies rectal pain.

## 2024-06-02 NOTE — ED Provider Notes (Signed)
 [facility]  Note:  This document was prepared using Conservation officer, historic buildings and may include unintentional dictation errors.  MRN: [id number] DOB: September 20, 2001  Subjective:   Jesse Rhodes is a 23 y.o. male presenting for 1 week history of persistent blood in the stool and on the toilet paper when he defecates.  No fever, nausea, vomiting, diarrhea, constipation, painful defecation, straining to defecate, rashes, urinary symptoms.  No history of GI disorders.  Family history is pertinent for 2 uncles on his mother side that passed away from colon cancer.  No current facility-administered medications for this encounter.  Current Outpatient Medications:    albuterol  (VENTOLIN  HFA) 108 (90 Base) MCG/ACT inhaler, Inhale 2 puffs into the lungs every 6 (six) hours as needed. For wheeze or shortness of breath, Disp: 18 g, Rfl: 1   azelastine  (ASTELIN ) 0.1 % nasal spray, Place 2 sprays into both nostrils 2 (two) times daily. Use in each nostril as directed, Disp: 30 mL, Rfl: 12   EPINEPHrine  0.3 mg/0.3 mL IJ SOAJ injection, Inject 0.3 mg into the muscle See admin instructions., Disp: 1 each, Rfl: 0   escitalopram  (LEXAPRO ) 10 MG tablet, Take 1 tablet (10 mg total) by mouth daily., Disp: 30 tablet, Rfl: 5   fluticasone  (FLONASE ) 50 MCG/ACT nasal spray, 1-2 sprays in each nostril, Disp: 16 g, Rfl: 3   fluticasone  (FLOVENT  HFA) 110 MCG/ACT inhaler, 2 puffs, Disp: 1 each, Rfl: 6   ipratropium-albuterol  (DUONEB) 0.5-2.5 (3) MG/3ML SOLN, Take 3 mLs by nebulization every 6 (six) hours as needed., Disp: 360 mL, Rfl: 0   montelukast  (SINGULAIR ) 10 MG tablet, 1 tablet in the evening, Disp: 60 tablet, Rfl: 2   Olopatadine HCl 0.2 % SOLN, 1 drop into affected eye, Disp: , Rfl:    promethazine -dextromethorphan (PROMETHAZINE -DM) 6.25-15 MG/5ML syrup, Take 5 mLs by mouth 3 (three) times daily as needed for cough. (Patient not taking: Reported on 03/15/2024), Disp: 200 mL, Rfl: 0    Spacer/Aero-Holding Chambers (AEROCHAMBER PLUS FLO-VU LARGE) MISC, See admin instructions., Disp: , Rfl:    Allergies  Allergen Reactions   Advair Diskus [Fluticasone -Salmeterol] Rash   Amoxicillin Rash   Chocolate Hives, Itching and Rash   Penicillins Rash    Past Medical History:  Diagnosis Date   ADHD (attention deficit hyperactivity disorder)    Allergy    Anxiety    Asthma    Autism    Constipation    Depression      Past Surgical History:  Procedure Laterality Date   ADENOIDECTOMY      Family History  Problem Relation Age of Onset   Hypertension Mother     Social History   Tobacco Use   Smoking status: Never   Smokeless tobacco: Never  Vaping Use   Vaping status: Never Used  Substance Use Topics   Alcohol use: Never   Drug use: Never    ROS   Objective:   Vitals: BP 139/84 (BP Location: Left Arm)   Pulse 97   Temp 98.7 F (37.1 C) (Oral)   Resp 16   SpO2 97%   Physical Exam Constitutional:      General: He is not in acute distress.    Appearance: Normal appearance. He is well-developed and normal weight. He is not ill-appearing, toxic-appearing or diaphoretic.  HENT:     Head: Normocephalic and atraumatic.     Right Ear: External ear normal.     Left Ear: External ear normal.  Nose: Nose normal.     Mouth/Throat:     Pharynx: Oropharynx is clear.   Eyes:     General: No scleral icterus.       Right eye: No discharge.        Left eye: No discharge.     Extraocular Movements: Extraocular movements intact.    Cardiovascular:     Rate and Rhythm: Normal rate.  Pulmonary:     Effort: Pulmonary effort is normal.  Abdominal:     General: Bowel sounds are normal. There is no distension.     Palpations: Abdomen is soft. There is no mass.     Tenderness: There is no abdominal tenderness. There is no right CVA tenderness, left CVA tenderness, guarding or rebound.  Genitourinary:    Rectum: No mass, tenderness, anal fissure or external  hemorrhoid.   Musculoskeletal:     Cervical back: Normal range of motion.   Neurological:     Mental Status: He is alert and oriented to person, place, and time.   Psychiatric:        Mood and Affect: Mood normal.        Behavior: Behavior normal.        Thought Content: Thought content normal.        Judgment: Judgment normal.     Assessment and Plan :   PDMP not reviewed this encounter.  1. Blood in stool    Perianal exam unremarkable.  Undifferentiated blood in stool.  As patient has no elements of constipation, hemorrhoids, fissures, abscess recommend further evaluation with a gastroenterologist.  Risk factors do include family history colon cancer in 2 uncles.  For now recommend high-fiber diet, hydrating well.   Christopher Savannah, [place] 06/02/24 1258

## 2024-06-02 NOTE — Telephone Encounter (Signed)
 FYI Only or Action Required?: Action required by provider: request for appointment.  Patient was last seen in primary care on 03/15/2024 by Vicci Barnie NOVAK, MD. Called Nurse Triage reporting Blood In Stools. Symptoms began a week ago. Interventions attempted: Rest, hydration, or home remedies. Symptoms are: unchanged.  Triage Disposition: See PCP Within 2 Weeks-to Urgent Care to be evaluated per mom-asking for a phone call from office to be scheduled for office visit.   Patient/caregiver understands and will follow disposition?: No, wishes to speak with PCP  Copied from CRM [id number]. Topic: Clinical - Red Word Triage >> Jun 02, 2024  9:22 AM Berneda FALCON wrote: Red Word that prompted transfer to Nurse Triage: Pt's mother Bryan is calling for patient stating he has had  blood in his stool for about a week and a half now. Reason for Disposition  [1] Rectal bleeding is minimal (e.g., blood just on toilet paper, few drops, streaks on surface of normal formed BM) AND [2] bleeding recurs 3 or more times on treatment  Answer Assessment - Initial Assessment Questions 1. APPEARANCE of BLOOD: What color is it? Is it passed separately, on the surface of the stool, or mixed in with the stool?      Mixed in with stool 2. AMOUNT: How much blood was passed?      Unsure of how much 3. FREQUENCY: How many times has blood been passed with the stools?      unsure 4. ONSET: When was the blood first seen in the stools? (Days or weeks)      About a week 5. DIARRHEA: Is there also some diarrhea? If Yes, ask: How many diarrhea stools in the past 24 hours?      no 6. CONSTIPATION: Do you have constipation? If Yes, ask: How bad is it?     no 7. RECURRENT SYMPTOMS: Have you had blood in your stools before? If Yes, ask: When was the last time? and What happened that time?      no 8. BLOOD THINNERS: Do you take any blood thinners? (e.g., Coumadin/warfarin, Pradaxa/dabigatran, aspirin)      no 9. OTHER SYMPTOMS: Do you have any other symptoms?  (e.g., abdomen pain, vomiting, dizziness, fever)     no  Protocols used: Rectal Bleeding-A-AH

## 2024-06-02 NOTE — Progress Notes (Signed)
 Patient ID: Jesse Rhodes, male    DOB: 2001/04/14  MRN: [id number]  CC: Rectal Bleeding (Blood in stool X1 week,)   Subjective: Jesse Rhodes is a 23 y.o. male who presents for chronic ds management. His concerns today include:  Jesse Rhodes, his mother, is with him.  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 No ADHD based on his last pediatrician's eval per his mother. Hx of asthma and MDD/GAD followed by Merrily, HL   Discussed the use of AI scribe software for clinical note transcription with the patient, who gave verbal consent to proceed.  History of Present Illness Jesse Rhodes is a 23 year old male who presents with rectal bleeding. He is accompanied by Jesse Rhodes, who appears to be a family member.  He has experienced intermittent rectal bleeding for about a week, occurring with some bowel movements. Blood is mixed with the stool. There is no rectal pain during bowel movements or spontaneous bleeding outside of bowel movements.  Bowel movements have been soft.  His mother shows me a picture of his bowel movement from this morning.  There is frank blood mixed in with the stool.  He also took a picture of the toilet paper that had blood on it.  rior to this week, he has not had similar symptoms.  He has no lightheadedness, dizziness, fatigue, or abdominal pain. He has not started any new medications recently.  His diet is high in spicy and processed foods, which Tameka is trying to modify.  Apparently he was seen at urgent care earlier today for the same.  He has been referred to gastroenterology.    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 For wheeze or shortness of breath 18 g 1   azelastine  (ASTELIN ) 0.1 % nasal spray Place 2 sprays into both nostrils 2 (two) times daily. Use in each nostril as directed 30 mL 12   EPINEPHrine  0.3 mg/0.3 mL IJ SOAJ injection Inject 0.3 mg into the muscle See admin instructions. 1 each 0   escitalopram  (LEXAPRO ) 10 MG tablet Take 1 tablet (10 mg total) by mouth daily. 30 tablet 5   fluticasone  (FLONASE ) 50 MCG/ACT nasal spray 1-2 sprays in each nostril 16 g 3   fluticasone  (FLOVENT  HFA) 110 MCG/ACT inhaler 2 puffs 1 each 6   ipratropium-albuterol  (DUONEB) 0.5-2.5 (3) MG/3ML SOLN Take 3 mLs by nebulization every 6 (six) hours as needed. 360 mL 0   montelukast  (SINGULAIR ) 10 MG tablet 1 tablet in the evening 60 tablet 2   Olopatadine HCl 0.2 % SOLN 1 drop into affected eye     Spacer/Aero-Holding Chambers (AEROCHAMBER PLUS FLO-VU LARGE) MISC See admin instructions.     promethazine -dextromethorphan (PROMETHAZINE -DM) 6.25-15 MG/5ML syrup Take 5 mLs by mouth  3 (three) times daily as needed for cough. (Patient not taking: Reported on 06/02/2024) 200 mL 0   No current facility-administered medications on file prior to visit.    Allergies  Allergen Reactions   Advair Diskus [Fluticasone -Salmeterol] Rash   Amoxicillin Rash   Chocolate Hives, Itching and Rash   Penicillins Rash    Social History   Socioeconomic History   Marital status: Single    Spouse name: Not on file   Number of children: Not on file   Years of education: Not on file   Highest education level:  12th grade  Occupational History   Occupation: sales  Tobacco Use   Smoking status: Never   Smokeless tobacco: Never  Vaping Use   Vaping status: Never Used  Substance and Sexual Activity   Alcohol use: Never   Drug use: Never   Sexual activity: Never  Other Topics Concern   Not on file  Social History Narrative   Not on file   Social Drivers of Health   Financial Resource Strain: Low Risk  (10/06/2023)   Overall Financial Resource Strain (CARDIA)    Difficulty of Paying Living Expenses: Not hard at all  Food Insecurity: No Food Insecurity (10/06/2023)   Hunger Vital Sign    Worried About Running Out of Food in the Last Year: Never true    Ran Out of Food in the Last Year: Never true  Transportation Needs: No Transportation Needs (10/06/2023)   PRAPARE - Administrator, Civil Service (Medical): No    Lack of Transportation (Non-Medical): No  Physical Activity: Insufficiently Active (10/06/2023)   Exercise Vital Sign    Days of Exercise per Week: 2 days    Minutes of Exercise per Session: 60 min  Stress: No Stress Concern Present (10/06/2023)   [employer] of Occupational Health - Occupational Stress Questionnaire    Feeling of Stress : Not at all  Social Connections: Moderately Isolated (10/06/2023)   Social Connection and Isolation Panel    Frequency of Communication with Friends and Family: Twice a week    Frequency of Social Gatherings with Friends and Family: Twice a week    Attends Religious Services: More than 4 times per year    Active Member of [employer] or Organizations: No    Attends Banker Meetings: Never    Marital Status: Never married  Intimate Partner Violence: Not At Risk (10/06/2023)   Humiliation, Afraid, Rape, and Kick questionnaire    Fear of Current or Ex-Partner: No    Emotionally Abused: No    Physically Abused: No    Sexually Abused: No    Family History  Problem Relation Age of Onset   Hypertension Mother      Past Surgical History:  Procedure Laterality Date   ADENOIDECTOMY      ROS: Review of Systems Negative except as stated above  PHYSICAL EXAM: BP 114/72 (BP Location: Left Arm, Patient Position: Sitting, Cuff Size: Normal)   Pulse 75   Temp 97.6 F (36.4 C) (Oral)   Ht 5' 8 (1.727 m)   Wt 167 lb (75.8 kg)   SpO2 98%   BMI 25.39 kg/m   Wt Readings from Last 3 Encounters:  06/02/24 167 lb (75.8 kg)  03/15/24 172 lb (78 kg)  10/06/23 175 lb (79.4 kg)    Physical Exam   General appearance - alert, well appearing, and in no distress Mental status - alert, oriented to person, place, and time Rectal -CMA  Parris is present: There is no external hemorrhoids.  No internal hemorrhoids noted protruding from the rectum when patient bears down.  No stools palpated in the rectum.  He did have some bloody slime.  Hemoccult positive     Latest Ref Rng & Units 03/15/2024    1:47 PM 04/07/2022    9:51 AM 11/29/2018    9:27 AM  CMP  Glucose 70 - 99 mg/dL 77  79  83   BUN 6 - 20 mg/dL 12  14  14    Creatinine 0.76 - 1.27 mg/dL 9.12  9.01  9.05   Sodium 134 - 144 mmol/L 142  140  141   Potassium 3.5 - 5.2 mmol/L 4.3  4.5  4.5   Chloride 96 - 106 mmol/L 103  104  104   CO2 20 - 29 mmol/L 23  24  23    Calcium 8.7 - 10.2 mg/dL 9.6  9.8  9.4   Total Protein 6.0 - 8.5 g/dL 6.9  6.7  6.5   Total Bilirubin 0.0 - 1.2 mg/dL 0.3  0.4  0.5   Alkaline Phos 44 - 121 IU/L 48  50  56   AST 0 - 40 IU/L 22  22  18    ALT 0 - 44 IU/L 30  29  17     Lipid Panel     Component Value Date/Time   CHOL 214 (H) 03/15/2024 1347   TRIG 138 03/15/2024 1347   HDL 37 (L) 03/15/2024 1347   CHOLHDL 5.8 (H) 03/15/2024 1347   LDLCALC 152 (H) 03/15/2024 1347    CBC    Component Value Date/Time   WBC 4.2 03/15/2024 1347   WBC 4.2 (L) 02/05/2010 0029   RBC 5.49 03/15/2024 1347   RBC 4.26 02/05/2010 0029   HGB 16.5 03/15/2024 1347   HCT 48.4 03/15/2024 1347   PLT 203 03/15/2024 1347   MCV 88  03/15/2024 1347   MCH 30.1 03/15/2024 1347   MCHC 34.1 03/15/2024 1347   MCHC 35.7 02/05/2010 0029   RDW 13.5 03/15/2024 1347   LYMPHSABS 2.1 04/07/2022 0951   MONOABS 0.6 02/05/2010 0029   EOSABS 0.2 04/07/2022 0951   BASOSABS 0.0 04/07/2022 0951    ASSESSMENT AND PLAN: 1. Frank blood in stool (Primary) Patient reporting blood mixed in with the stools for the past 1 week.  Bowel movements are soft and there has been no rectal or abdominal pain.  No hemorrhoids or tears noted on exam.  He will need further evaluation by GI.  Referral has already been submitted.  Will check CBC today.    Patient was given the opportunity to ask questions.  Patient verbalized understanding of the plan and was able to repeat key elements of the plan.   This documentation was completed using Paediatric nurse.  Any transcriptional errors are unintentional.  No orders of the defined types were placed in this encounter.    Requested Prescriptions    No prescriptions requested or ordered in this encounter    No follow-ups on file.  Barnie Louder, MD, FACP

## 2024-06-02 NOTE — Patient Instructions (Signed)
 You been referred to the gastroenterologist. If you have frank bleeding from the rectum with or without abdominal pain, be seen in the emergency room.

## 2024-06-02 NOTE — Telephone Encounter (Signed)
 It looks like he has an appointment scheduled for today already.

## 2024-06-03 ENCOUNTER — Ambulatory Visit: Payer: Self-pay | Admitting: Internal Medicine

## 2024-06-03 LAB — CBC
Hematocrit: 46.8 % (ref 37.5–51.0)
Hemoglobin: 15.6 g/dL (ref 13.0–17.7)
MCH: 29.3 pg (ref 26.6–33.0)
MCHC: 33.3 g/dL (ref 31.5–35.7)
MCV: 88 fL (ref 79–97)
Platelets: 216 10*3/uL (ref 150–450)
RBC: 5.33 x10E6/uL (ref 4.14–5.80)
RDW: 13.1 % (ref 11.6–15.4)
WBC: 4 10*3/uL (ref 3.4–10.8)

## 2024-06-06 ENCOUNTER — Emergency Department (HOSPITAL_COMMUNITY)
Admission: EM | Admit: 2024-06-06 | Discharge: 2024-06-06 | Disposition: A | Discharge status: Home / Self Care | Attending: Emergency Medicine | Admitting: Emergency Medicine

## 2024-06-06 ENCOUNTER — Other Ambulatory Visit: Payer: Self-pay

## 2024-06-06 DIAGNOSIS — K625 Hemorrhage of anus and rectum: Secondary | ICD-10-CM | POA: Insufficient documentation

## 2024-06-06 DIAGNOSIS — J45909 Unspecified asthma, uncomplicated: Secondary | ICD-10-CM | POA: Insufficient documentation

## 2024-06-06 DIAGNOSIS — F840 Autistic disorder: Secondary | ICD-10-CM | POA: Insufficient documentation

## 2024-06-06 DIAGNOSIS — Z7951 Long term (current) use of inhaled steroids: Secondary | ICD-10-CM | POA: Diagnosis not present

## 2024-06-06 LAB — I-STAT CHEM 8, ED
BUN: 16 mg/dL (ref 6–20)
Calcium, Ion: 1.22 mmol/L (ref 1.15–1.40)
Chloride: 106 mmol/L (ref 98–111)
Creatinine, Ser: 1.1 mg/dL (ref 0.61–1.24)
Glucose, Bld: 92 mg/dL (ref 70–99)
HCT: 46 % (ref 39.0–52.0)
Hemoglobin: 15.6 g/dL (ref 13.0–17.0)
Potassium: 4.1 mmol/L (ref 3.5–5.1)
Sodium: 140 mmol/L (ref 135–145)
TCO2: 21 mmol/L — ABNORMAL LOW (ref 22–32)

## 2024-06-06 NOTE — Discharge Instructions (Addendum)
 Follow-up with your gastroenterologist as scheduled.  Return to the emergency room if you have any worsening symptoms.

## 2024-06-06 NOTE — ED Triage Notes (Signed)
 Pt reports continuing blood in the toilet and on paper when wiping. Denies constipation, pain, urinary sx. Denies dizziness or shob.

## 2024-06-06 NOTE — ED Provider Notes (Signed)
 [place] EMERGENCY DEPARTMENT AT [facility] Provider Note   CSN: [id number] Arrival date & time: 06/06/24  9153     Patient presents with: Rectal Bleeding   Jesse Rhodes is a 23 y.o. male.   Patient is a 23 year old male with a history of high functioning autism, asthma and ADHD who presents with blood in his stool.  It started about a week and a half ago.  He had brown stool with some red blood mixed with it.  Since that time he has had some intermittent episodes of that although most recently his stool has been without blood.  Yesterday morning he had blood on the toilet paper and then the afternoon he had a bowel movement that was brown and nonbloody without any blood when he wiped on the toilet paper.  He has not had a bowel movement today.  He denies any associated abdominal pain.  No dizziness or shortness of breath.  No pain with bowel movements.  He says his stools are soft.  No history of constipation.  He was seen on June 26 at an urgent care and his primary care office.  He was not noted to have any visible hemorrhoids or fissures.  Referral for gastroenterology was placed.  Mom states that they have an appointment in 3 weeks with gastroenterology.  He denies any significant weight loss.       Prior to Admission medications   Medication Sig Start Date End Date Taking? Authorizing Provider  albuterol  (VENTOLIN  HFA) 108 (90 Base) MCG/ACT inhaler Inhale 2 puffs into the lungs every 6 (six) hours as needed. For wheeze or shortness of breath 01/15/23   Brien Belvie BRAVO, MD  azelastine  (ASTELIN ) 0.1 % nasal spray Place 2 sprays into both nostrils 2 (two) times daily. Use in each nostril as directed 01/15/23   Brien Belvie BRAVO, MD  EPINEPHrine  0.3 mg/0.3 mL IJ SOAJ injection Inject 0.3 mg into the muscle See admin instructions. 12/30/23   Christopher Savannah, PA-C  escitalopram  (LEXAPRO ) 10 MG tablet Take 1 tablet (10 mg total) by mouth daily. 01/15/23   Brien Belvie BRAVO, MD   fluticasone  (FLONASE ) 50 MCG/ACT nasal spray 1-2 sprays in each nostril 01/15/23   Brien Belvie BRAVO, MD  fluticasone  (FLOVENT  HFA) 110 MCG/ACT inhaler 2 puffs 03/15/24   Vicci Barnie NOVAK, MD  ipratropium-albuterol  (DUONEB) 0.5-2.5 (3) MG/3ML SOLN Take 3 mLs by nebulization every 6 (six) hours as needed. 04/13/23   Teresa Shelba SAUNDERS, NP  montelukast  (SINGULAIR ) 10 MG tablet 1 tablet in the evening 01/15/23   Brien Belvie BRAVO, MD  Olopatadine HCl 0.2 % SOLN 1 drop into affected eye    [provider]  promethazine -dextromethorphan (PROMETHAZINE -DM) 6.25-15 MG/5ML syrup Take 5 mLs by mouth 3 (three) times daily as needed for cough. Patient not taking: Reported on 06/02/2024 12/30/23   Christopher Savannah, PA-C  Spacer/Aero-Holding Chambers (AEROCHAMBER PLUS FLO-VU LARGE) MISC See admin instructions.    [provider]    Allergies: Advair diskus [fluticasone -salmeterol], Amoxicillin, Chocolate, and Penicillins    Review of Systems  Constitutional:  Negative for chills, diaphoresis, fatigue and fever.  Eyes: Negative.   Respiratory:  Negative for cough, chest tightness and shortness of breath.   Cardiovascular:  Negative for chest pain and leg swelling.  Gastrointestinal:  Positive for anal bleeding. Negative for abdominal pain, blood in stool, diarrhea, nausea, rectal pain and vomiting.  Genitourinary:  Negative for difficulty urinating and flank pain.  Musculoskeletal:  Negative for arthralgias and  back pain.  Neurological:  Negative for dizziness, weakness and headaches.    Updated Vital Signs BP 122/77 (BP Location: Left Arm)   Pulse 93   Temp 98.3 F (36.8 C) (Oral)   Resp 18   SpO2 99%   Physical Exam Constitutional:      Appearance: He is well-developed.  HENT:     Head: Normocephalic and atraumatic.   Eyes:     Pupils: Pupils are equal, round, and reactive to light.    Cardiovascular:     Rate and Rhythm: Normal rate and regular rhythm.     Heart sounds: Normal  heart sounds.  Pulmonary:     Effort: Pulmonary effort is normal. No respiratory distress.     Breath sounds: Normal breath sounds. No wheezing or rales.  Chest:     Chest wall: No tenderness.  Abdominal:     General: Bowel sounds are normal.     Palpations: Abdomen is soft.     Tenderness: There is no abdominal tenderness. There is no guarding or rebound.  Genitourinary:    Comments: With tech present as chaperone, rectal area was examined.  No external hemorrhoids visualized.  No gross blood, no fissures noted.  No pain in the area.  Musculoskeletal:        General: Normal range of motion.     Cervical back: Normal range of motion and neck supple.  Lymphadenopathy:     Cervical: No cervical adenopathy.   Skin:    General: Skin is warm and dry.     Findings: No rash.   Neurological:     Mental Status: He is alert and oriented to person, place, and time.     (all labs ordered are listed, but only abnormal results are displayed) Labs Reviewed  I-STAT CHEM 8, ED    EKG: None  Radiology: No results found.   Procedures   Medications Ordered in the ED - No data to display                                  Medical Decision Making  This patient presents to the ED for concern of rectal bleeding, this involves an extensive number of treatment options, and is a complaint that carries with it a high risk of complications and morbidity.  I considered the following differential and admission for this acute, potentially life threatening condition.  The differential diagnosis includes constipation, hemorrhoids, diverticular bleed, fissure.  Colon cancer  MDM:    Patient is a 23 year old who presents with rectal bleeding.  Has been going on about a week and a half although most recently it seems to be improving.  He does not have any reports of constipation.  He states his stools are soft.  I do not see any visualized blood or hemorrhoids.  We did recheck a hemoglobin today which  was normal.  He does not have abdominal pain or other symptoms that would warrant CT scanning of his abdomen.  He is to be stable for discharge and outpatient follow-up.  They will follow-up with a gastroenterologist as scheduled.  Mom will call and see if they have had any earlier cancellations.  Return precautions were given  (Labs, imaging, consults)  Labs: I Ordered, and personally interpreted labs.  The pertinent results include: Hemoglobin normal  Imaging Studies ordered: I ordered imaging studies including none  Additional history obtained from chart review.  External records  from outside source obtained and reviewed including prior visit notes  Cardiac Monitoring: The patient was not maintained on a cardiac monitor.  If on the cardiac monitor, I personally viewed and interpreted the cardiac monitored which showed an underlying rhythm of:    Reevaluation: After the interventions noted above, I reevaluated the patient and found that they have :stayed the same  Social Determinants of Health:    Disposition:   Discharged home  Co morbidities that complicate the patient evaluation  Past Medical History:  Diagnosis Date   ADHD (attention deficit hyperactivity disorder)    Allergy    Anxiety    Asthma    Autism    Constipation    Depression      Medicines No orders of the defined types were placed in this encounter.   I have reviewed the patients home medicines and have made adjustments as needed  Problem List / ED Course: Problem List Items Addressed This Visit   None            Final diagnoses:  None    ED Discharge Orders     None          Lenor Hollering, MD 06/06/24 1000

## 2024-06-07 ENCOUNTER — Encounter: Payer: Self-pay | Admitting: Internal Medicine

## 2024-06-13 ENCOUNTER — Ambulatory Visit (INDEPENDENT_AMBULATORY_CARE_PROVIDER_SITE_OTHER): Admitting: Gastroenterology

## 2024-06-13 ENCOUNTER — Encounter: Payer: Self-pay | Admitting: Gastroenterology

## 2024-06-13 ENCOUNTER — Other Ambulatory Visit (INDEPENDENT_AMBULATORY_CARE_PROVIDER_SITE_OTHER)

## 2024-06-13 VITALS — BP 130/90 | HR 80 | Ht 68.0 in | Wt 164.0 lb

## 2024-06-13 DIAGNOSIS — K625 Hemorrhage of anus and rectum: Secondary | ICD-10-CM

## 2024-06-13 DIAGNOSIS — R194 Change in bowel habit: Secondary | ICD-10-CM

## 2024-06-13 DIAGNOSIS — R140 Abdominal distension (gaseous): Secondary | ICD-10-CM | POA: Diagnosis not present

## 2024-06-13 LAB — C-REACTIVE PROTEIN: CRP: <1 mg/dL (ref 0.5–20.0)

## 2024-06-13 MED ORDER — BENEFIBER PO CHEW: CHEWABLE_TABLET | ORAL | Status: AC

## 2024-06-13 MED ORDER — NA SULFATE-K SULFATE-MG SULF 17.5-3.13-1.6 GM/177ML PO SOLN: 1.0000 | Freq: Once | ORAL | 0 refills | Status: AC

## 2024-06-13 NOTE — Progress Notes (Signed)
 Chief Complaint:bright blood in stool in the absence of hemorrhoids, constipation, fissures, abscess.  Primary GI Doctor: Dr. San  HPI:  Patient is a  23  year old A.A. male patient with past medical history of high functioning autism, asthma and ADHD, who was referred to me by [facility], PA-C on 06/02/24 for a complaint of bright blood in stool in the absence of hemorrhoids, constipation, fissures, abscess.  06/02/24 pt presented to [facility] Urgent care for blood in stool. Labs show: Hgb 15.6, plt 216, normal kidney and liver function. Rectal exam normal. Referral placed to GI.  06/06/24 pt went to [facility] ER for rectal bleeding. Labs show: Hgb 15.6, BUN 16, Creat 1.10  Interval History    Patient presents with main complaint of blood in stool for last couple of weeks, accompanied by his mother. He reports blood in stool and with wiping at least 50% of the time he has bowel movement . He does report he had some recent bloating and issues having bowel movement that has since then resolved. He made dietary changes with more fiber and has been increasing exercise. He now has one bowel movement daily. No abdominal pain. No rectal pain. He noted he had blood in stool today.  No new mediations. No recent illness. He reports it is worse with spicy food.   No nausea or vomiting.   No alcohol use. Nonsmoker.   No NSAID's.   Never seen by gastroenterologist.   Patient's family history includes 2 uncles (one half uncle) with colon CA. Mother reports no family member with IBD.   Wt Readings from Last 3 Encounters:  06/13/24 164 lb (74.4 kg)  06/02/24 167 lb (75.8 kg)  03/15/24 172 lb (78 kg)     Past Medical History:  Diagnosis Date   ADHD (attention deficit hyperactivity disorder)    Allergy    Anxiety    Asthma    Autism    Constipation    Depression     Past Surgical History:  Procedure Laterality Date   ADENOIDECTOMY      Current Outpatient Medications  Medication Sig  Dispense Refill   albuterol  (VENTOLIN  HFA) 108 (90 Base) MCG/ACT inhaler Inhale 2 puffs into the lungs every 6 (six) hours as needed. For wheeze or shortness of breath 18 g 1   azelastine  (ASTELIN ) 0.1 % nasal spray Place 2 sprays into both nostrils 2 (two) times daily. Use in each nostril as directed 30 mL 12   EPINEPHrine  0.3 mg/0.3 mL IJ SOAJ injection Inject 0.3 mg into the muscle See admin instructions. 1 each 0   escitalopram  (LEXAPRO ) 10 MG tablet Take 1 tablet (10 mg total) by mouth daily. 30 tablet 5   fluticasone  (FLONASE ) 50 MCG/ACT nasal spray 1-2 sprays in each nostril 16 g 3   fluticasone  (FLOVENT  HFA) 110 MCG/ACT inhaler 2 puffs 1 each 6   ipratropium-albuterol  (DUONEB) 0.5-2.5 (3) MG/3ML SOLN Take 3 mLs by nebulization every 6 (six) hours as needed. 360 mL 0   montelukast  (SINGULAIR ) 10 MG tablet 1 tablet in the evening 60 tablet 2   Na Sulfate-K Sulfate-Mg Sulfate concentrate (SUPREP) 17.5-3.13-1.6 GM/177ML SOLN Take 1 kit (354 mLs total) by mouth once for 1 dose. 354 mL 0   Olopatadine HCl 0.2 % SOLN 1 drop into affected eye     promethazine -dextromethorphan (PROMETHAZINE -DM) 6.25-15 MG/5ML syrup Take 5 mLs by mouth 3 (three) times daily as needed for cough. 200 mL 0   Spacer/Aero-Holding Chambers (AEROCHAMBER PLUS FLO-VU  LARGE) MISC See admin instructions.     Wheat Dextrin (BENEFIBER) CHEW daily     No current facility-administered medications for this visit.    Allergies as of 06/13/2024 - Review Complete 06/13/2024  Allergen Reaction Noted   Advair diskus [fluticasone -salmeterol] Rash 09/03/2018   Amoxicillin Rash 06/02/2024   Chocolate Hives, Itching, and Rash 07/16/2012   Penicillins Rash 12/12/2011    Family History  Problem Relation Age of Onset   Hypertension Mother    Colon cancer Neg Hx    Esophageal cancer Neg Hx    Liver disease Neg Hx     Review of Systems:    Constitutional: No weight loss, fever, chills, weakness or fatigue HEENT: Eyes: No change  in vision               Ears, Nose, Throat:  No change in hearing or congestion Skin: No rash or itching Cardiovascular: No chest pain, chest pressure or palpitations   Respiratory: No SOB or cough Gastrointestinal: See HPI and otherwise negative Genitourinary: No dysuria or change in urinary frequency Neurological: No headache, dizziness or syncope Musculoskeletal: No new muscle or joint pain Hematologic: No bleeding or bruising Psychiatric: No history of depression or anxiety    Physical Exam:  Vital signs: BP (!) 130/90   Pulse 80   Ht 5' 8 (1.727 m)   Wt 164 lb (74.4 kg)   BMI 24.94 kg/m   Constitutional:   Pleasant A.A.  male appears to be in NAD, Well developed, Well nourished, alert and cooperative Throat: Oral cavity and pharynx without inflammation, swelling or lesion.  Respiratory: Respirations even and unlabored. Lungs clear to auscultation bilaterally.   No wheezes, crackles, or rhonchi.  Cardiovascular: Normal S1, S2. Regular rate and rhythm. No peripheral edema, cyanosis or pallor.  Gastrointestinal:  Soft, nondistended, nontender. No rebound or guarding. Normal bowel sounds. No appreciable masses or hepatomegaly. Rectal:  Not performed. Declined.  Msk:  Symmetrical without gross deformities. Without edema, no deformity or joint abnormality.  Neurologic:  Alert and  oriented x4;  grossly normal neurologically.  Skin:   Dry and intact without significant lesions or rashes. Psychiatric: Oriented to person, place and time. Demonstrates good judgement and reason without abnormal affect or behaviors.  RELEVANT LABS AND IMAGING: CBC    Latest Ref Rng & Units 06/06/2024    9:45 AM 06/02/2024    3:44 PM 03/15/2024    1:47 PM  CBC  WBC 3.4 - 10.8 x10E3/uL  4.0  4.2   Hemoglobin 13.0 - 17.0 g/dL 84.3  84.3  83.4   Hematocrit 39.0 - 52.0 % 46.0  46.8  48.4   Platelets 150 - 450 x10E3/uL  216  203      CMP     Latest Ref Rng & Units 06/06/2024    9:45 AM 03/15/2024     1:47 PM 04/07/2022    9:51 AM  CMP  Glucose 70 - 99 mg/dL 92  77  79   BUN 6 - 20 mg/dL 16  12  14    Creatinine 0.61 - 1.24 mg/dL 8.89  9.12  9.01   Sodium 135 - 145 mmol/L 140  142  140   Potassium 3.5 - 5.1 mmol/L 4.1  4.3  4.5   Chloride 98 - 111 mmol/L 106  103  104   CO2 20 - 29 mmol/L  23  24   Calcium 8.7 - 10.2 mg/dL  9.6  9.8   Total Protein 6.0 -  8.5 g/dL  6.9  6.7   Total Bilirubin 0.0 - 1.2 mg/dL  0.3  0.4   Alkaline Phos 44 - 121 IU/L  48  50   AST 0 - 40 IU/L  22  22   ALT 0 - 44 IU/L  30  29      Lab Results  Component Value Date   TSH 3.610 04/07/2022     Assessment: Encounter Diagnoses  Name Primary?   Rectal bleeding Yes   Altered bowel habits    Bloating      23 year old A.A. male patient who presents with altered bowel habits, bloating, and rectal bleeding at least 50% of bowel movements over the course of the past month. Patient reports he made some dietary changes with incorporating more fiber which helped with frequency of bowel movements, however he continues with rectal bleeding. He has had rectal exam by ER provider that was normal, declines another exam today. Will check inflammatory marker and celiac markers. Will also schedule colonoscopy with biopsy to rule out IBD, crohn's or UC.   Plan: -Check CRP, TTG IgA, IgA -Recommend High fiber diet -Start Benefiber -Schedule for a colonoscopy in [facility] with Dr. Jeanette. The risks and benefits of colonoscopy with possible polypectomy / biopsies were discussed and the patient agrees to proceed.    Thank you for the courtesy of this consult. Please call me with any questions or concerns.   Calley Drenning, FNP-C [facility] 06/13/2024, 9:00 AM  Cc: Vicci Barnie NOVAK, MD

## 2024-06-13 NOTE — Patient Instructions (Addendum)
 Recommend high fiber diet OTC Benefiber gummies No straining or pushing with bowel movements Squatty potty may help with bowel movements   You have been scheduled for a colonoscopy. Please follow written instructions given to you at your visit today.   If you use inhalers (even only as needed), please bring them with you on the day of your procedure.  DO NOT TAKE 7 DAYS PRIOR TO TEST- Trulicity (dulaglutide) Ozempic, Wegovy (semaglutide) Mounjaro (tirzepatide) Bydureon Bcise (exanatide extended release)  DO NOT TAKE 1 DAY PRIOR TO YOUR TEST Rybelsus (semaglutide) Adlyxin (lixisenatide) Victoza (liraglutide) Byetta (exanatide) ___________________________________________________________________________   Please go to the lab in the basement of our building to have lab work done as you leave today. Hit B for basement when you get on the elevator.  When the doors open the lab is on your left.  We will call you with the results. Thank you.  Thank you for entrusting me with your care and for choosing [facility], Deanna May, NP   If your blood pressure at your visit was 140/90 or greater, please contact your primary care physician to follow up on this. ______________________________________________________  If you are age 87 or older, your body mass index should be between 23-30. Your Body mass index is 24.94 kg/m. If this is out of the aforementioned range listed, please consider follow up with your Primary Care Provider.  If you are age 87 or younger, your body mass index should be between 19-25. Your Body mass index is 24.94 kg/m. If this is out of the aformentioned range listed, please consider follow up with your Primary Care Provider.  ________________________________________________________  The [facility] providers would like to encourage you to use MYCHART to communicate with providers for non-urgent requests or questions.  Due to long hold times on the telephone,  sending your provider a message by [facility] may be a faster and more efficient way to get a response.  Please allow 48 business hours for a response.  Please remember that this is for non-urgent requests.  _______________________________________________________  Due to recent changes in healthcare laws, you may see the results of your imaging and laboratory studies on MyChart before your provider has had a chance to review them.  We understand that in some cases there may be results that are confusing or concerning to you. Not all laboratory results come back in the same time frame and the provider may be waiting for multiple results in order to interpret others.  Please give us  48 hours in order for your provider to thoroughly review all the results before contacting the office for clarification of your results.

## 2024-06-14 LAB — IGA: Immunoglobulin A: 105 mg/dL (ref 47–310)

## 2024-06-14 LAB — TISSUE TRANSGLUTAMINASE, IGA: (tTG) Ab, IgA: <1 U/mL

## 2024-06-15 ENCOUNTER — Ambulatory Visit: Payer: Medicare Other | Admitting: Nurse Practitioner

## 2024-06-16 ENCOUNTER — Ambulatory Visit: Payer: Self-pay | Admitting: Gastroenterology

## 2024-06-22 ENCOUNTER — Ambulatory Visit: Admitting: Physician Assistant

## 2024-06-22 NOTE — Progress Notes (Signed)
 Agree with the assessment and plan as outlined by [facility], FNP-C.  Carlitos Bottino, DO, [facility]

## 2024-08-04 ENCOUNTER — Ambulatory Visit: Payer: Self-pay | Admitting: Gastroenterology

## 2024-08-04 ENCOUNTER — Other Ambulatory Visit: Payer: Self-pay | Admitting: *Deleted

## 2024-08-04 ENCOUNTER — Other Ambulatory Visit (INDEPENDENT_AMBULATORY_CARE_PROVIDER_SITE_OTHER)

## 2024-08-04 ENCOUNTER — Ambulatory Visit: Admitting: Gastroenterology

## 2024-08-04 ENCOUNTER — Encounter: Payer: Self-pay | Admitting: Gastroenterology

## 2024-08-04 VITALS — BP 113/51 | HR 56 | Temp 97.2°F | Resp 15 | Ht 68.0 in | Wt 164.0 lb

## 2024-08-04 DIAGNOSIS — K5120 Ulcerative (chronic) proctitis without complications: Secondary | ICD-10-CM

## 2024-08-04 DIAGNOSIS — K6289 Other specified diseases of anus and rectum: Secondary | ICD-10-CM | POA: Diagnosis not present

## 2024-08-04 DIAGNOSIS — K625 Hemorrhage of anus and rectum: Secondary | ICD-10-CM

## 2024-08-04 DIAGNOSIS — K51211 Ulcerative (chronic) proctitis with rectal bleeding: Secondary | ICD-10-CM | POA: Diagnosis not present

## 2024-08-04 DIAGNOSIS — R194 Change in bowel habit: Secondary | ICD-10-CM

## 2024-08-04 LAB — SEDIMENTATION RATE: Sed Rate: 1 mm/h (ref 0–15)

## 2024-08-04 MED ORDER — SODIUM CHLORIDE 0.9 % IV SOLN
500.0000 mL | Freq: Once | INTRAVENOUS | Status: DC
Start: 2024-08-04 — End: 2024-08-04

## 2024-08-04 MED ORDER — MESALAMINE 1000 MG RE SUPP
1000.0000 mg | Freq: Every day | RECTAL | 2 refills | Status: DC
Start: 2024-08-04 — End: 2024-09-05

## 2024-08-04 NOTE — Progress Notes (Signed)
 GASTROENTEROLOGY PROCEDURE H&P NOTE   Primary Care Physician: Vicci Barnie NOVAK, MD    Reason for Procedure:   Hematochezia  Plan:    Colonoscopy  Patient is appropriate for endoscopic procedure(s) in the ambulatory ([facility]) setting.  The nature of the procedure, as well as the risks, benefits, and alternatives were carefully and thoroughly reviewed with the patient. Ample time for discussion and questions allowed. The patient understood, was satisfied, and agreed to proceed.     HPI: Jesse Rhodes is a 23 y.o. male who presents for colonoscopy for evaluation of hematochezia.   Patient's family history includes 2 uncles (one half uncle) with colon CA. Mother reports no family member with IBD.   CBC normal, no anemia. Normal Celiac serologies and CRP.   Otherwise, no significant changes in clinical history since last OV on 06/13/2024.   Past Medical History:  Diagnosis Date   ADHD (attention deficit hyperactivity disorder)    Allergy    Anxiety    Asthma    Autism    Constipation    Depression    Hyperlipidemia     Past Surgical History:  Procedure Laterality Date   ADENOIDECTOMY      Prior to Admission medications   Medication Sig Start Date End Date Taking? Authorizing Provider  albuterol  (VENTOLIN  HFA) 108 (90 Base) MCG/ACT inhaler Inhale 2 puffs into the lungs every 6 (six) hours as needed. For wheeze or shortness of breath 01/15/23  Yes Brien Belvie BRAVO, MD  fluticasone  (FLONASE ) 50 MCG/ACT nasal spray 1-2 sprays in each nostril 01/15/23  Yes Brien Belvie BRAVO, MD  fluticasone  (FLOVENT  HFA) 110 MCG/ACT inhaler 2 puffs 03/15/24  Yes Vicci Barnie NOVAK, MD  azelastine  (ASTELIN ) 0.1 % nasal spray Place 2 sprays into both nostrils 2 (two) times daily. Use in each nostril as directed 01/15/23   Brien Belvie BRAVO, MD  EPINEPHrine  0.3 mg/0.3 mL IJ SOAJ injection Inject 0.3 mg into the muscle See admin instructions. 12/30/23   Christopher Savannah, PA-C  escitalopram  (LEXAPRO ) 10 MG  tablet Take 1 tablet (10 mg total) by mouth daily. 01/15/23   Brien Belvie BRAVO, MD  ipratropium-albuterol  (DUONEB) 0.5-2.5 (3) MG/3ML SOLN Take 3 mLs by nebulization every 6 (six) hours as needed. 04/13/23   Teresa Shelba SAUNDERS, NP  montelukast  (SINGULAIR ) 10 MG tablet 1 tablet in the evening 01/15/23   Brien Belvie BRAVO, MD  Olopatadine HCl 0.2 % SOLN 1 drop into affected eye    [provider]  promethazine -dextromethorphan (PROMETHAZINE -DM) 6.25-15 MG/5ML syrup Take 5 mLs by mouth 3 (three) times daily as needed for cough. 12/30/23   Christopher Savannah, PA-C  Spacer/Aero-Holding Chambers (AEROCHAMBER PLUS FLO-VU LARGE) MISC See admin instructions.    [provider]  Wheat Dextrin (BENEFIBER) CHEW daily 06/13/24   May, Deanna J, NP    Current Outpatient Medications  Medication Sig Dispense Refill   albuterol  (VENTOLIN  HFA) 108 (90 Base) MCG/ACT inhaler Inhale 2 puffs into the lungs every 6 (six) hours as needed. For wheeze or shortness of breath 18 g 1   fluticasone  (FLONASE ) 50 MCG/ACT nasal spray 1-2 sprays in each nostril 16 g 3   fluticasone  (FLOVENT  HFA) 110 MCG/ACT inhaler 2 puffs 1 each 6   azelastine  (ASTELIN ) 0.1 % nasal spray Place 2 sprays into both nostrils 2 (two) times daily. Use in each nostril as directed 30 mL 12   EPINEPHrine  0.3 mg/0.3 mL IJ SOAJ injection Inject 0.3 mg into the muscle See admin instructions. 1 each  0   escitalopram  (LEXAPRO ) 10 MG tablet Take 1 tablet (10 mg total) by mouth daily. 30 tablet 5   ipratropium-albuterol  (DUONEB) 0.5-2.5 (3) MG/3ML SOLN Take 3 mLs by nebulization every 6 (six) hours as needed. 360 mL 0   montelukast  (SINGULAIR ) 10 MG tablet 1 tablet in the evening 60 tablet 2   Olopatadine HCl 0.2 % SOLN 1 drop into affected eye     promethazine -dextromethorphan (PROMETHAZINE -DM) 6.25-15 MG/5ML syrup Take 5 mLs by mouth 3 (three) times daily as needed for cough. 200 mL 0   Spacer/Aero-Holding Chambers (AEROCHAMBER PLUS FLO-VU LARGE) MISC See  admin instructions.     Wheat Dextrin (BENEFIBER) CHEW daily     Current Facility-Administered Medications  Medication Dose Route Frequency Provider Last Rate Last Admin   0.9 %  sodium chloride  infusion  500 mL Intravenous Once Shewanda Sharpe V, DO        Allergies as of 08/04/2024 - Review Complete 08/04/2024  Allergen Reaction Noted   Food Other (See Comments) 08/04/2024   Advair diskus [fluticasone -salmeterol] Rash 09/03/2018   Amoxicillin Rash 06/02/2024   Chocolate Hives, Itching, and Rash 07/16/2012   Penicillins Rash 12/12/2011    Family History  Problem Relation Age of Onset   Hypertension Mother    Colon cancer Paternal Uncle    Esophageal cancer Neg Hx    Liver disease Neg Hx    Rectal cancer Neg Hx    Stomach cancer Neg Hx     Social History   Socioeconomic History   Marital status: Single    Spouse name: Not on file   Number of children: 0   Years of education: Not on file   Highest education level: 12th grade  Occupational History   Occupation: Airline pilot  Tobacco Use   Smoking status: Never   Smokeless tobacco: Never  Vaping Use   Vaping status: Never Used  Substance and Sexual Activity   Alcohol use: Never   Drug use: Never   Sexual activity: Never  Other Topics Concern   Not on file  Social History Narrative   Not on file   Social Drivers of Health   Financial Resource Strain: Low Risk  (10/06/2023)   Overall Financial Resource Strain (CARDIA)    Difficulty of Paying Living Expenses: Not hard at all  Food Insecurity: No Food Insecurity (10/06/2023)   Hunger Vital Sign    Worried About Running Out of Food in the Last Year: Never true    Ran Out of Food in the Last Year: Never true  Transportation Needs: No Transportation Needs (10/06/2023)   PRAPARE - Administrator, Civil Service (Medical): No    Lack of Transportation (Non-Medical): No  Physical Activity: Insufficiently Active (10/06/2023)   Exercise Vital Sign    Days of  Exercise per Week: 2 days    Minutes of Exercise per Session: 60 min  Stress: No Stress Concern Present (10/06/2023)   [employer] of Occupational Health - Occupational Stress Questionnaire    Feeling of Stress : Not at all  Social Connections: Moderately Isolated (10/06/2023)   Social Connection and Isolation Panel    Frequency of Communication with Friends and Family: Twice a week    Frequency of Social Gatherings with Friends and Family: Twice a week    Attends Religious Services: More than 4 times per year    Active Member of [employer] or Organizations: No    Attends Banker Meetings: Never    Marital Status:  Never married  Intimate Partner Violence: Not At Risk (10/06/2023)   Humiliation, Afraid, Rape, and Kick questionnaire    Fear of Current or Ex-Partner: No    Emotionally Abused: No    Physically Abused: No    Sexually Abused: No    Physical Exam: Vital signs in last 24 hours: @BP  125/66   Pulse 90   Temp (!) 97.2 F (36.2 C)   Ht 5' 8 (1.727 m)   Wt 164 lb (74.4 kg)   SpO2 96%   BMI 24.94 kg/m  GEN: NAD EYE: Sclerae anicteric ENT: MMM CV: Non-tachycardic Pulm: CTA b/l GI: Soft, NT/ND NEURO:  Alert & Oriented x 3   Sandor Flatter, DO [facility]   08/04/2024 11:29 AM

## 2024-08-04 NOTE — Op Note (Signed)
 [facility] Patient Name: Jesse Rhodes Procedure Date: 08/04/2024 11:31 AM MRN: [id number] Endoscopist: Jesse Rhodes , MD, [id number] Age: 23 Referring MD:  Date of Birth: [birth year] Gender: Male Account #: [id number] Procedure:                Colonoscopy Indications:              Hematochezia                           CBC normal, no anemia. Normal Celiac serologies and                            CRP. Medicines:                Monitored Anesthesia Care Procedure:                Pre-Anesthesia Assessment:                           - Prior to the procedure, a History and Physical                            was performed, and patient medications and                            allergies were reviewed. The patient's tolerance of                            previous anesthesia was also reviewed. The risks                            and benefits of the procedure and the sedation                            options and risks were discussed with the patient.                            All questions were answered, and informed consent                            was obtained. Prior Anticoagulants: The patient has                            taken no anticoagulant or antiplatelet agents. ASA                            Grade Assessment: II - A patient with mild systemic                            disease. After reviewing the risks and benefits,                            the patient was deemed in satisfactory condition to  undergo the procedure.                           After obtaining informed consent, the colonoscope                            was passed under direct vision. Throughout the                            procedure, the patient's blood pressure, pulse, and                            oxygen saturations were monitored continuously. The                            Olympus CF-HQ190L ([id number]) Colonoscope was                            introduced  through the anus and advanced to the the                            terminal ileum. The colonoscopy was performed                            without difficulty. The patient tolerated the                            procedure well. The quality of the bowel                            preparation was excellent. The terminal ileum,                            ileocecal valve, appendiceal orifice, and rectum                            were photographed. Scope In: 11:37:12 AM Scope Out: 11:52:51 AM Scope Withdrawal Time: 0 hours 12 minutes 52 seconds  Total Procedure Duration: 0 hours 15 minutes 39 seconds  Findings:                 The perianal and digital rectal examinations were                            normal.                           Localized mild inflammation characterized by                            decreased vascularity, congestion (edema), and                            erythema was found in the rectum. There was a clear  line of transition to normal mucosa at 10 cm from                            the anal verge. Biopsies were taken with a cold                            forceps for histology (jar 5). Estimated blood loss                            was minimal.                           The mucosa was ootherwise normal from the                            recto-sigmoid colon through the cecum appeared                            normal. Biopsies were taken with a cold forceps for                            histology (jar 1-4). Estimated blood loss was                            minimal.                           The terminal ileum appeared normal. Complications:            No immediate complications. Estimated Blood Loss:     Estimated blood loss was minimal. Impression:               - Localized mild inflammation was found in the                            rectum secondary to newly diagnosed proctitis                            ulcerative colitis.  Biopsied.                           - The cecum and recto-sigmoid colon are normal.                            Biopsied.                           - The examined portion of the ileum was normal. Recommendation:           - Patient has a contact number available for                            emergencies. The signs and symptoms of potential                            delayed complications were discussed  with the                            patient. Return to normal activities tomorrow.                            Written discharge instructions were provided to the                            patient.                           - Resume previous diet.                           - Continue present medications.                           - Await pathology results.                           - Check ESR and fecal calprotectin at next                            available.                           - Use Canasa  1000 mg suppository 1 per rectum QHS.                           - Follow-up with Dr. San or Cathryne May, NP                            in the GI clinic in approximately 4-6 weeks, or                            sooner as needed. Jesse San, MD 08/04/2024 12:01:55 PM

## 2024-08-04 NOTE — Progress Notes (Signed)
 Called to room to assist during endoscopic procedure.  Patient ID and intended procedure confirmed with present staff. Received instructions for my participation in the procedure from the performing physician.

## 2024-08-04 NOTE — Progress Notes (Signed)
 Mother is pt's legal guardian- signed consent form with pt and helped with questions

## 2024-08-04 NOTE — Progress Notes (Signed)
 Sedate, gd SR, tolerated procedure well, VSS, report to RN

## 2024-08-04 NOTE — Patient Instructions (Addendum)
 Resume previous diet  Continue present medications  Await pathology results  Check ESR and fecal calprotectin at next available  Use Canasa  1000mg  suppository 1 per rectum at bedtime  Follow up with Dr. San or Cathryne May, NP in the GI clinic in the next 4-6 weeks   YOU HAD AN ENDOSCOPIC PROCEDURE TODAY AT THE [facility]:   Refer to the procedure report that was given to you for any specific questions about what was found during the examination.  If the procedure report does not answer your questions, please call your gastroenterologist to clarify.  If you requested that your care partner not be given the details of your procedure findings, then the procedure report has been included in a sealed envelope for you to review at your convenience later.  YOU SHOULD EXPECT: Some feelings of bloating in the abdomen. Passage of more gas than usual.  Walking can help get rid of the air that was put into your GI tract during the procedure and reduce the bloating. If you had a lower endoscopy (such as a colonoscopy or flexible sigmoidoscopy) you may notice spotting of blood in your stool or on the toilet paper. If you underwent a bowel prep for your procedure, you may not have a normal bowel movement for a few days.  Please Note:  You might notice some irritation and congestion in your nose or some drainage.  This is from the oxygen used during your procedure.  There is no need for concern and it should clear up in a day or so.  SYMPTOMS TO REPORT IMMEDIATELY: Following lower endoscopy (colonoscopy or flexible sigmoidoscopy):  Excessive amounts of blood in the stool  Significant tenderness or worsening of abdominal pains  Swelling of the abdomen that is new, acute  Fever of 100F or higher For urgent or emergent issues, a gastroenterologist can be reached at any hour by calling [phone]. Do not use MyChart messaging for urgent concerns.   DIET:  We do recommend a small meal at  first, but then you may proceed to your regular diet.  Drink plenty of fluids but you should avoid alcoholic beverages for 24 hours.  ACTIVITY:  You should plan to take it easy for the rest of today and you should NOT DRIVE or use heavy machinery until tomorrow (because of the sedation medicines used during the test).    FOLLOW UP: Our staff will call the number listed on your records the next business day following your procedure.  We will call around 7:15- 8:00 am to check on you and address any questions or concerns that you may have regarding the information given to you following your procedure. If we do not reach you, we will leave a message.     If any biopsies were taken you will be contacted by phone or by letter within the next 1-3 weeks.  Please call us  at [phone] if you have not heard about the biopsies in 3 weeks.   SIGNATURES/CONFIDENTIALITY: You and/or your care partner have signed paperwork which will be entered into your electronic medical record.  These signatures attest to the fact that that the information above on your After Visit Summary has been reviewed and is understood.  Full responsibility of the confidentiality of this discharge information lies with you and/or your care-partner.

## 2024-08-05 ENCOUNTER — Telehealth: Payer: Self-pay | Admitting: *Deleted

## 2024-08-05 NOTE — Telephone Encounter (Signed)
  Follow up Call-     08/04/2024   10:29 AM  Call back number  Post procedure Call Back phone  # [phone] mom's number  Permission to leave phone message Yes     Patient questions:  Do you have a fever, pain , or abdominal swelling? No. Pain Score  0 *  Have you tolerated food without any problems? Yes.    Have you been able to return to your normal activities? Yes.    Do you have any questions about your discharge instructions: Diet   No. Medications  No. Follow up visit  No.  Do you have questions or concerns about your Care? No.  Actions: * If pain score is 4 or above: No action needed, pain <4.

## 2024-08-09 ENCOUNTER — Ambulatory Visit: Payer: Self-pay | Admitting: Gastroenterology

## 2024-08-09 LAB — SURGICAL PATHOLOGY

## 2024-08-11 ENCOUNTER — Other Ambulatory Visit (INDEPENDENT_AMBULATORY_CARE_PROVIDER_SITE_OTHER)

## 2024-08-11 DIAGNOSIS — K5120 Ulcerative (chronic) proctitis without complications: Secondary | ICD-10-CM | POA: Diagnosis not present

## 2024-08-18 LAB — CALPROTECTIN: Calprotectin: 9 ug/g

## 2024-09-05 ENCOUNTER — Ambulatory Visit: Admitting: Gastroenterology

## 2024-09-05 ENCOUNTER — Other Ambulatory Visit: Payer: Self-pay

## 2024-09-05 ENCOUNTER — Encounter: Payer: Self-pay | Admitting: Gastroenterology

## 2024-09-05 ENCOUNTER — Telehealth: Payer: Self-pay | Admitting: Gastroenterology

## 2024-09-05 VITALS — BP 120/70 | HR 82 | Ht 68.0 in | Wt 160.0 lb

## 2024-09-05 DIAGNOSIS — K5120 Ulcerative (chronic) proctitis without complications: Secondary | ICD-10-CM

## 2024-09-05 DIAGNOSIS — K625 Hemorrhage of anus and rectum: Secondary | ICD-10-CM

## 2024-09-05 DIAGNOSIS — K51919 Ulcerative colitis, unspecified with unspecified complications: Secondary | ICD-10-CM

## 2024-09-05 DIAGNOSIS — R194 Change in bowel habit: Secondary | ICD-10-CM

## 2024-09-05 MED ORDER — MESALAMINE 1000 MG RE SUPP: 1000.0000 mg | Freq: Every day | RECTAL | 2 refills | Status: DC

## 2024-09-05 NOTE — Patient Instructions (Addendum)
 Recommend low fodmap diet Can consider dietitian referral   Websites with information: https://www.crohnscolitisfoundation.org/blog/ibd-risk-factors-what-causes-crohns-colitis  _______________________________________________________  If your blood pressure at your visit was 140/90 or greater, please contact your primary care physician to follow up on this.  _______________________________________________________  If you are age 25 or older, your body mass index should be between 23-30. Your Body mass index is 24.33 kg/m. If this is out of the aforementioned range listed, please consider follow up with your Primary Care Provider.  If you are age 41 or younger, your body mass index should be between 19-25. Your Body mass index is 24.33 kg/m. If this is out of the aformentioned range listed, please consider follow up with your Primary Care Provider.   ________________________________________________________  The [facility] providers would like to encourage you to use MYCHART to communicate with providers for non-urgent requests or questions.  Due to long hold times on the telephone, sending your provider a message by [facility] may be a faster and more efficient way to get a response.  Please allow 48 business hours for a response.  Please remember that this is for non-urgent requests.  _______________________________________________________  [facility] is using a team-based approach to care.  Your team is made up of your doctor and two to three APPS. Our APPS (Nurse Practitioners and Physician Assistants) work with your physician to ensure care continuity for you. They are fully qualified to address your health concerns and develop a treatment plan. They communicate directly with your gastroenterologist to care for you. Seeing the Advanced Practice Practitioners on your physician's team can help you by facilitating care more promptly, often allowing for earlier appointments, access to  diagnostic testing, procedures, and other specialty referrals.   Thank you for trusting me with your gastrointestinal care. Deanna May, FNP-C

## 2024-09-05 NOTE — Telephone Encounter (Signed)
 Inbound call from patient requesting to get a referral for us  sent over to a Nutritionist. Patient is requesting a call back to inform him that his referral has been sent over. Please advise.

## 2024-09-05 NOTE — Telephone Encounter (Signed)
 Pt stated that he request to see a Nutritionist. Chart was reviewed and noted that pt did have an OV today with Deanna May NP and recommendation was for Recommend low fod map diet , if further assistance needed can put in dietitian referral. Referral placed in Epic. Pt made aware. Pt notified that their office will contact pt.  Pt verbalized understanding with all questions answered.

## 2024-09-05 NOTE — Progress Notes (Signed)
 Chief Complaint: follow-up colonoscopy, newly diagnosed Ulcerative colitis Primary GI Doctor: Dr. San  HPI:  Patient is a  23  year old A.A. male patient with past medical history of high functioning autism, asthma and ADHD, who was referred to me by [facility], PA-C on 06/02/24 for a complaint of bright blood in stool in the absence of hemorrhoids, constipation, fissures, abscess. Patient last seen in GI office by myself on 06/13/24.  06/02/24 pt presented to [facility] Urgent care for blood in stool. Labs show: Hgb 15.6, plt 216, normal kidney and liver function. Rectal exam normal. Referral placed to GI.  06/06/24 pt went to [facility] ER for rectal bleeding. Labs show: Hgb 15.6, BUN 16, Creat 1.10  Interval History    Patient presents for follow-up colonoscopy showing newly diagnosed ulcerative colitis. Accompanied by his mother. Patient has been doing the mesalamine  daily at bedtime for the past month. He reports the left sided abdominal pain, bloating, and rectal bleeding has all completely resolved. He has one bowel movement daily.   He enquires about dietary recommendations. He reports spicy foods seem to cause abdominal discomfort.   Patient's family history includes 2 uncles (one half uncle) with colon CA. Mother reports no family member with IBD.   GI procedures: 08/04/24 colonoscopy - Localized mild inflammation was found in the rectum secondary to newly diagnosed proctitis ulcerative colitis. Biopsied. - The cecum and recto- sigmoid colon are normal. Biopsied. - The examined portion of the ileum was normal. Path: chronic, active inflammation in the rectum, consistent with Ulcerative Colitis (proctitis). The biopsies taken throughout the remainder of the colon were otherwise normal.   Wt Readings from Last 3 Encounters:  08/04/24 164 lb (74.4 kg)  06/13/24 164 lb (74.4 kg)  06/02/24 167 lb (75.8 kg)     Past Medical History:  Diagnosis Date   ADHD (attention deficit  hyperactivity disorder)    Allergy    Anxiety    Asthma    Autism    Constipation    Depression    Hyperlipidemia     Past Surgical History:  Procedure Laterality Date   ADENOIDECTOMY      Current Outpatient Medications  Medication Sig Dispense Refill   albuterol  (VENTOLIN  HFA) 108 (90 Base) MCG/ACT inhaler Inhale 2 puffs into the lungs every 6 (six) hours as needed. For wheeze or shortness of breath 18 g 1   azelastine  (ASTELIN ) 0.1 % nasal spray Place 2 sprays into both nostrils 2 (two) times daily. Use in each nostril as directed 30 mL 12   EPINEPHrine  0.3 mg/0.3 mL IJ SOAJ injection Inject 0.3 mg into the muscle See admin instructions. 1 each 0   escitalopram  (LEXAPRO ) 10 MG tablet Take 1 tablet (10 mg total) by mouth daily. 30 tablet 5   fluticasone  (FLONASE ) 50 MCG/ACT nasal spray 1-2 sprays in each nostril 16 g 3   fluticasone  (FLOVENT  HFA) 110 MCG/ACT inhaler 2 puffs 1 each 6   ipratropium-albuterol  (DUONEB) 0.5-2.5 (3) MG/3ML SOLN Take 3 mLs by nebulization every 6 (six) hours as needed. 360 mL 0   mesalamine  (CANASA ) 1000 MG suppository Place 1 suppository (1,000 mg total) rectally at bedtime. 30 suppository 2   montelukast  (SINGULAIR ) 10 MG tablet 1 tablet in the evening 60 tablet 2   Olopatadine HCl 0.2 % SOLN 1 drop into affected eye     promethazine -dextromethorphan (PROMETHAZINE -DM) 6.25-15 MG/5ML syrup Take 5 mLs by mouth 3 (three) times daily as needed for cough. 200 mL 0  Spacer/Aero-Holding Chambers (AEROCHAMBER PLUS FLO-VU LARGE) MISC See admin instructions.     Wheat Dextrin (BENEFIBER) CHEW daily     No current facility-administered medications for this visit.    Allergies as of 09/05/2024 - Review Complete 08/04/2024  Allergen Reaction Noted   Food Other (See Comments) 08/04/2024   Advair diskus [fluticasone -salmeterol] Rash 09/03/2018   Amoxicillin Rash 06/02/2024   Chocolate Hives, Itching, and Rash 07/16/2012   Penicillins Rash 12/12/2011     Family History  Problem Relation Age of Onset   Hypertension Mother    Colon cancer Paternal Uncle    Esophageal cancer Neg Hx    Liver disease Neg Hx    Rectal cancer Neg Hx    Stomach cancer Neg Hx     Review of Systems:    Constitutional: No weight loss, fever, chills, weakness or fatigue HEENT: Eyes: No change in vision               Ears, Nose, Throat:  No change in hearing or congestion Skin: No rash or itching Cardiovascular: No chest pain, chest pressure or palpitations   Respiratory: No SOB or cough Gastrointestinal: See HPI and otherwise negative Genitourinary: No dysuria or change in urinary frequency Neurological: No headache, dizziness or syncope Musculoskeletal: No new muscle or joint pain Hematologic: No bleeding or bruising Psychiatric: No history of depression or anxiety    Physical Exam:  Vital signs: There were no vitals taken for this visit.  Constitutional:   Pleasant A.A.  male appears to be in NAD, Well developed, Well nourished, alert and cooperative Throat: Oral cavity and pharynx without inflammation, swelling or lesion.  Respiratory: Respirations even and unlabored. Lungs clear to auscultation bilaterally.   No wheezes, crackles, or rhonchi.  Cardiovascular: Normal S1, S2. Regular rate and rhythm. No peripheral edema, cyanosis or pallor.  Gastrointestinal:  Soft, nondistended, nontender. No rebound or guarding. Normal bowel sounds. No appreciable masses or hepatomegaly. Rectal:  Not performed.  Msk:  Symmetrical without gross deformities. Without edema, no deformity or joint abnormality.  Neurologic:  Alert and  oriented x4;  grossly normal neurologically.  Skin:   Dry and intact without significant lesions or rashes. Psychiatric: Oriented to person, place and time. Demonstrates good judgement and reason without abnormal affect or behaviors.  RELEVANT LABS AND IMAGING: CBC    Latest Ref Rng & Units 06/06/2024    9:45 AM 06/02/2024    3:44 PM  03/15/2024    1:47 PM  CBC  WBC 3.4 - 10.8 x10E3/uL  4.0  4.2   Hemoglobin 13.0 - 17.0 g/dL 84.3  84.3  83.4   Hematocrit 39.0 - 52.0 % 46.0  46.8  48.4   Platelets 150 - 450 x10E3/uL  216  203      CMP     Latest Ref Rng & Units 06/06/2024    9:45 AM 03/15/2024    1:47 PM 04/07/2022    9:51 AM  CMP  Glucose 70 - 99 mg/dL 92  77  79   BUN 6 - 20 mg/dL 16  12  14    Creatinine 0.61 - 1.24 mg/dL 8.89  9.12  9.01   Sodium 135 - 145 mmol/L 140  142  140   Potassium 3.5 - 5.1 mmol/L 4.1  4.3  4.5   Chloride 98 - 111 mmol/L 106  103  104   CO2 20 - 29 mmol/L  23  24   Calcium 8.7 - 10.2 mg/dL  9.6  9.8  Total Protein 6.0 - 8.5 g/dL  6.9  6.7   Total Bilirubin 0.0 - 1.2 mg/dL  0.3  0.4   Alkaline Phos 44 - 121 IU/L  48  50   AST 0 - 40 IU/L  22  22   ALT 0 - 44 IU/L  30  29      Lab Results  Component Value Date   TSH 3.610 04/07/2022  06/13/24 labs show: CRP< 1, TTG IgA, IgA-neg 08/04/24 ESR- 1 08/11/24 fecal calportectin-9  Assessment: Encounter Diagnoses  Name Primary?   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 male patient who presents for follow-up on colonoscopy for rectal bleeding and abd pain. Colonoscopy showed localized mild inflammation was found in the rectum secondary to newly diagnosed proctitis ulcerative colitis. Normal CRP and ESR. Fecal calprotectin normal. Patient has responded well to daily mesalamine  suppository, will discuss with Dr. San how he would like to continue medication management. Pt enquired about dietary recommendations provided him with low fodmap diet. Recommend yearly eye exams and dermatology appointments.  Plan: -Continue Canasa  1000 mg suppository 1 per rectum at bedtime, refilled  -Recommend low fod map diet , if further assistance needed can put in dietitian referral - 6 mth fup with APP or Dr. San  Thank you for the courtesy of this consult. Please call  me with any questions or concerns.   Chiyoko Torrico, FNP-C  Gastroenterology 09/05/2024, 6:50 AM  Cc: Vicci Barnie NOVAK, MD

## 2024-09-14 NOTE — Progress Notes (Signed)
 Agree with the assessment and plan as outlined by [facility], FNP-C.  Carlitos Bottino, DO, [facility]

## 2024-09-15 ENCOUNTER — Encounter: Payer: Self-pay | Admitting: Internal Medicine

## 2024-09-15 ENCOUNTER — Ambulatory Visit: Attending: Internal Medicine | Admitting: Internal Medicine

## 2024-09-15 VITALS — BP 116/76 | HR 68 | Ht 68.0 in | Wt 159.0 lb

## 2024-09-15 DIAGNOSIS — K5120 Ulcerative (chronic) proctitis without complications: Secondary | ICD-10-CM | POA: Insufficient documentation

## 2024-09-15 DIAGNOSIS — F840 Autistic disorder: Secondary | ICD-10-CM | POA: Insufficient documentation

## 2024-09-15 DIAGNOSIS — Z79899 Other long term (current) drug therapy: Secondary | ICD-10-CM | POA: Diagnosis not present

## 2024-09-15 DIAGNOSIS — Z23 Encounter for immunization: Secondary | ICD-10-CM

## 2024-09-15 DIAGNOSIS — F411 Generalized anxiety disorder: Secondary | ICD-10-CM | POA: Diagnosis not present

## 2024-09-15 DIAGNOSIS — F32A Depression, unspecified: Secondary | ICD-10-CM | POA: Diagnosis not present

## 2024-09-15 DIAGNOSIS — J4520 Mild intermittent asthma, uncomplicated: Secondary | ICD-10-CM | POA: Diagnosis not present

## 2024-09-15 DIAGNOSIS — L649 Androgenic alopecia, unspecified: Secondary | ICD-10-CM | POA: Diagnosis not present

## 2024-09-15 DIAGNOSIS — F330 Major depressive disorder, recurrent, mild: Secondary | ICD-10-CM

## 2024-09-15 DIAGNOSIS — K5190 Ulcerative colitis, unspecified, without complications: Secondary | ICD-10-CM | POA: Diagnosis present

## 2024-09-15 NOTE — Progress Notes (Signed)
 Patient ID: Jesse Rhodes, male    DOB: [birth year]  MRN: [id number]  CC: Follow-up (Follow-up. /Colonoscopy completed on 08/01/24 - please discuss results/Flu vax administered on 09/15/24 - C.A.)   Subjective: Jesse Rhodes is a 23 y.o. male who presents for follow up on new UC diagnosis and chronic disease management. His concerns today include:  [name], his mother, is with him.  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 No ADHD based on his last pediatrician's eval per his mother. Hx of asthma and MDD/GAD followed by [facility], HL, proctitis ulcerative colitis  Proctitis ulcerative colitis: Previously presented for abd pain and BRBPR on 06/02/2024. He was referred to GI, their workup included a colonoscopy which found evidence of localized mild inflammation consistent with proctitis ulcerative colitis. He was started on suppository mesalamine  1000 mg daily on 08/04/2024 which resulted in strong improvement in his abd pain and resolved the blood in his stools. Patient was also provided a low foodmap containing diet restrictions and recommended yearly eye exams and dermatology appointments.  Today he reports his abd pain has continued to be well controlled since taking the mesalamine  suppositories. He endorses mild LLQ pain every other day, that is a 1/10 in severity, lasts for 5 minutes, and will go away w/o pain medications. It has been 1.5 months since having blood in his stools. He is still figuring out which foods trigger his sx but has  been consuming more fruits and vegetables while avoiding junk food, spicy foods, and processed foods. He has lost 5 pounds since 08/04/2024, and his BMI is now in the healthy range. He has been using his food map to guide his diet and has reviewed it, but would still like a referral to a dietitian.  Denies skin changes, but reports hair loss on his head. Per his mother this does not run in his family, and they would like to see  dermatology. He will call to schedule his yearly eye exam. F/U with GI is in 5-6 months  Asthma: He has not had any recent asthma flares. Only takes Flovent  inhaler, albuterol  inhaler, Duoneb inhaler, and Astelin  nasal spray PRN. Continues to take montelukast  daily.   Depression: His depression is well-managed under the care of [facility] which he goes to every 3 months and on Lexapro  10mg .   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 For wheeze or shortness of breath 18 g 1   azelastine  (ASTELIN ) 0.1 % nasal spray Place 2 sprays into both nostrils 2 (two) times daily. Use in each nostril as directed 30 mL 12   EPINEPHrine  0.3 mg/0.3 mL IJ SOAJ injection Inject 0.3 mg into the muscle See admin instructions. 1 each 0   escitalopram  (LEXAPRO ) 10 MG tablet Take 1 tablet (10 mg total) by mouth daily. 30 tablet  5   fluticasone  (FLONASE ) 50 MCG/ACT nasal spray 1-2 sprays in each nostril 16 g 3   fluticasone  (FLOVENT  HFA) 110 MCG/ACT inhaler 2 puffs 1 each 6   ipratropium-albuterol  (DUONEB) 0.5-2.5 (3) MG/3ML SOLN Take 3 mLs by nebulization every 6 (six) hours as needed. 360 mL 0   mesalamine  (CANASA ) 1000 MG suppository Place 1 suppository (1,000 mg total) rectally at bedtime. 30 suppository 2   montelukast  (SINGULAIR ) 10 MG tablet 1  tablet in the evening 60 tablet 2   Olopatadine HCl 0.2 % SOLN 1 drop into affected eye     promethazine -dextromethorphan (PROMETHAZINE -DM) 6.25-15 MG/5ML syrup Take 5 mLs by mouth 3 (three) times daily as needed for cough. 200 mL 0   Spacer/Aero-Holding Chambers (AEROCHAMBER PLUS FLO-VU LARGE) MISC See admin instructions.     Wheat Dextrin (BENEFIBER) CHEW daily     No current facility-administered medications on file prior to visit.    Allergies  Allergen Reactions   Food Other (See Comments)    All nuts per allergy test- possibly hives is the reaction   Advair Diskus [Fluticasone -Salmeterol] Rash   Amoxicillin Rash   Chocolate Hives, Itching and Rash   Penicillins Rash    Social History   Socioeconomic History   Marital status: Single    Spouse name: Not on file   Number of children: 0   Years of education: Not on file   Highest education level: 12th grade  Occupational History   Occupation: Airline pilot  Tobacco Use   Smoking status: Never   Smokeless tobacco: Never  Vaping Use   Vaping status: Never Used  Substance and Sexual Activity   Alcohol use: Never   Drug use: Never   Sexual activity: Never  Other Topics Concern   Not on file  Social History Narrative   Not on file   Social Drivers of Health   Financial Resource Strain: Low Risk  (10/06/2023)   Overall Financial Resource Strain (CARDIA)    Difficulty of Paying Living Expenses: Not hard at all  Food Insecurity: No Food Insecurity (10/06/2023)   Hunger Vital Sign    Worried About Running Out of Food in the Last Year: Never true    Ran Out of Food in the Last Year: Never true  Transportation Needs: No Transportation Needs (10/06/2023)   PRAPARE - Administrator, Civil Service (Medical): No    Lack of Transportation (Non-Medical): No  Physical Activity: Insufficiently Active (10/06/2023)   Exercise Vital Sign    Days of Exercise per Week: 2 days    Minutes of Exercise per Session: 60 min   Stress: No Stress Concern Present (10/06/2023)   [employer] of Occupational Health - Occupational Stress Questionnaire    Feeling of Stress : Not at all  Social Connections: Moderately Isolated (10/06/2023)   Social Connection and Isolation Panel    Frequency of Communication with Friends and Family: Twice a week    Frequency of Social Gatherings with Friends and Family: Twice a week    Attends Religious Services: More than 4 times per year    Active Member of [employer] or Organizations: No    Attends Banker Meetings: Never    Marital Status: Never married  Intimate Partner Violence: Not At Risk (10/06/2023)   Humiliation, Afraid, Rape, and Kick questionnaire    Fear of Current or Ex-Partner: No    Emotionally Abused: No    Physically Abused: No    Sexually Abused: No  Family History  Problem Relation Age of Onset   Hypertension Mother    Colon cancer Paternal Uncle    Esophageal cancer Neg Hx    Liver disease Neg Hx    Rectal cancer Neg Hx    Stomach cancer Neg Hx     Past Surgical History:  Procedure Laterality Date   ADENOIDECTOMY      ROS: Review of Systems Negative except as stated above  PHYSICAL EXAM: BP 116/76 (BP Location: Left Arm, Patient Position: Sitting, Cuff Size: Normal)   Pulse 68   Ht 5' 8 (1.727 m)   Wt 159 lb (72.1 kg)   SpO2 100%   BMI 24.18 kg/m   Physical Exam  General appearance - alert, well appearing young AA man in no distress Mental status - normal mood, behavior, speech, dress, motor activity, and thought processes Head- hair is cut low but the length is symmetric and uniform. There is thinning of hair over the crown and frontal scalp with preservation of the occipital and lateral hairline. Negative traction test Chest - clear to auscultation, no wheezes, rales or rhonchi, symmetric air entry Heart - normal rate, regular rhythm, normal S1, S2, no murmurs, rubs, clicks or gallops     Latest Ref Rng & Units  06/06/2024    9:45 AM 03/15/2024    1:47 PM 04/07/2022    9:51 AM  CMP  Glucose 70 - 99 mg/dL 92  77  79   BUN 6 - 20 mg/dL 16  12  14    Creatinine 0.61 - 1.24 mg/dL 8.89  9.12  9.01   Sodium 135 - 145 mmol/L 140  142  140   Potassium 3.5 - 5.1 mmol/L 4.1  4.3  4.5   Chloride 98 - 111 mmol/L 106  103  104   CO2 20 - 29 mmol/L  23  24   Calcium 8.7 - 10.2 mg/dL  9.6  9.8   Total Protein 6.0 - 8.5 g/dL  6.9  6.7   Total Bilirubin 0.0 - 1.2 mg/dL  0.3  0.4   Alkaline Phos 44 - 121 IU/L  48  50   AST 0 - 40 IU/L  22  22   ALT 0 - 44 IU/L  30  29    Lipid Panel     Component Value Date/Time   CHOL 214 (H) 03/15/2024 1347   TRIG 138 03/15/2024 1347   HDL 37 (L) 03/15/2024 1347   CHOLHDL 5.8 (H) 03/15/2024 1347   LDLCALC 152 (H) 03/15/2024 1347    CBC    Component Value Date/Time   WBC 4.0 06/02/2024 1544   WBC 4.2 (L) 02/05/2010 0029   RBC 5.33 06/02/2024 1544   RBC 4.26 02/05/2010 0029   HGB 15.6 06/06/2024 0945   HGB 15.6 06/02/2024 1544   HCT 46.0 06/06/2024 0945   HCT 46.8 06/02/2024 1544   PLT 216 06/02/2024 1544   MCV 88 06/02/2024 1544   MCH 29.3 06/02/2024 1544   MCHC 33.3 06/02/2024 1544   MCHC 35.7 02/05/2010 0029   RDW 13.1 06/02/2024 1544   LYMPHSABS 2.1 04/07/2022 0951   MONOABS 0.6 02/05/2010 0029   EOSABS 0.2 04/07/2022 0951   BASOSABS 0.0 04/07/2022 0951    ASSESSMENT AND PLAN:  Assessment and Plan  1. 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 Well controlled on mesalamine  suppository without significant weight changes and blood in stool has resolved. Advised him to follow the food map  given by GI closely, paying attention to foods that may trigger abdominal pain. Will send a referral to nutrition per their request. Refer to derm per GI recommendation even though he has no derm manifestation of IBD at this time. - Continue Mesalamine  per GI. 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 Will send a referral for skin assessment for #1 as above and for hair loss. - Ambulatory referral to Dermatology  3. Mild intermittent asthma without complication Stable. Continue Singulair  daily. Continue Flovent , Albuterol  and DuoNebs as needed.  4. Depressive disorder Mild - Stable on Lexapro  and followed by [facility]  5. Need for immunization against influenza - Flu vaccine trivalent PF, 6mos and older(Flulaval,Afluria,Fluarix,Fluzone)  6. HM  Advised him he can get his COVID-19 booster at any outside pharmacy since we do not have it here.  Patient was given the opportunity to ask questions.  Patient verbalized understanding of the plan and was able to repeat key elements of the plan.    Orders Placed This Encounter  Procedures   Flu vaccine trivalent PF, 6mos and older(Flulaval,Afluria,Fluarix,Fluzone)   Ambulatory referral to Dermatology   Amb ref to Medical Nutrition Therapy-MNT     Requested Prescriptions    No prescriptions requested or ordered in this encounter    Return in about 6 months (around 03/16/2025) for Medicare wellness visit in one month with LPN.  This is a Psychologist, occupational Note.  The care of the patient was discussed with Dr. Vicci and the assessment and plan formulated with her assistance.  Please see her attestation of this encounter.  Duwaine Amber, MS3 [employer]   Evaluation and management procedures were performed by me with Medical Student in attendance, note written by Medical Student under my supervision and collaboration. I have reviewed the note and I agree with the management and plan.   Barnie Vicci, MD, FAAFP. [facility] [place], [place] [phone]   09/15/2024, 1:13 PM

## 2024-09-15 NOTE — Patient Instructions (Addendum)
 You can get your COVID-19 booster at any outside pharmacy.  Will schedule you for your Medicare Annual Wellness visit.

## 2024-09-15 NOTE — Telephone Encounter (Signed)
 Staff reminder sent to POD B RN's to order fecal cal in 2 months & remind patient.

## 2024-09-16 ENCOUNTER — Telehealth: Payer: Self-pay

## 2024-09-16 NOTE — Telephone Encounter (Signed)
 Pt made aware. Refer back to patient message 09/15/24.

## 2024-09-16 NOTE — Telephone Encounter (Signed)
-----   Message from Cathryne PARAS May sent at 09/16/2024  9:00 AM EDT ----- Pod B- Can we make sure patient instructed per dr san on how to titrate the suppositories   Thanks, Deanna ----- Message ----- From: san Sandor GAILS, DO Sent: 09/14/2024   5:51 PM EDT To: Cathryne PARAS May, NP  I tend to continue daily for 2 months or so.  If complete resolution of all symptoms and repeat calprotectin is normal (was normal at diagnosis), then do every other day for a week, then stop and monitor.  If return of symptoms, can go right back to [facility] .  Sandor ----- Message ----- From: May, Deanna J, NP Sent: 09/05/2024  10:54 AM EDT To: Sandor san GAILS, DO  Dr. san- some titrate patients off medication, others continue daily for few mths. Please advise how you would like to continue with his mesalamine . Thanks Cathryne, NP

## 2024-10-06 ENCOUNTER — Other Ambulatory Visit: Payer: Self-pay | Admitting: Gastroenterology

## 2024-10-06 DIAGNOSIS — R194 Change in bowel habit: Secondary | ICD-10-CM

## 2024-10-06 DIAGNOSIS — K5120 Ulcerative (chronic) proctitis without complications: Secondary | ICD-10-CM

## 2024-10-06 DIAGNOSIS — K625 Hemorrhage of anus and rectum: Secondary | ICD-10-CM

## 2024-11-15 ENCOUNTER — Telehealth: Payer: Self-pay

## 2024-11-15 ENCOUNTER — Other Ambulatory Visit: Payer: Self-pay

## 2024-11-15 DIAGNOSIS — K51919 Ulcerative colitis, unspecified with unspecified complications: Secondary | ICD-10-CM

## 2024-11-15 NOTE — Telephone Encounter (Signed)
 Reminder was received in Epic. Chart reviewed. Order for lab was placed in Epic. Pt was made aware.  Location to lab provided.  Pt verbalized understanding with all questions answered.

## 2024-11-15 NOTE — Telephone Encounter (Signed)
-----   Message from Nurse Kaira C sent at 09/15/2024  8:35 AM EDT ----- Regarding: Stool Patient needs to repeat fecal cal (2 month follow up). Order needs to be placed & remind patient. Refer back to result note 09/15/24.

## 2024-11-28 ENCOUNTER — Other Ambulatory Visit

## 2024-11-28 DIAGNOSIS — K51919 Ulcerative colitis, unspecified with unspecified complications: Secondary | ICD-10-CM

## 2024-12-01 LAB — CALPROTECTIN, FECAL: Calprotectin, Fecal: 73 ug/g (ref 0–120)

## 2024-12-05 ENCOUNTER — Ambulatory Visit: Payer: Self-pay | Admitting: Gastroenterology

## 2024-12-11 ENCOUNTER — Other Ambulatory Visit: Payer: Self-pay | Admitting: Gastroenterology

## 2024-12-11 DIAGNOSIS — K625 Hemorrhage of anus and rectum: Secondary | ICD-10-CM

## 2024-12-11 DIAGNOSIS — R194 Change in bowel habit: Secondary | ICD-10-CM

## 2024-12-11 DIAGNOSIS — K5120 Ulcerative (chronic) proctitis without complications: Secondary | ICD-10-CM

## 2024-12-12 ENCOUNTER — Other Ambulatory Visit (HOSPITAL_COMMUNITY): Payer: Self-pay

## 2024-12-12 NOTE — Telephone Encounter (Signed)
 Contacted pharmacy because test claim shows too soon to fill. Technician able to inform me that there is no PA required and that the medication will be ready after 2:30 this afternoon with a co-pay of $1.60

## 2024-12-27 ENCOUNTER — Ambulatory Visit: Attending: Internal Medicine

## 2024-12-27 VITALS — Ht 68.0 in | Wt 158.0 lb

## 2024-12-27 DIAGNOSIS — Z0000 Encounter for general adult medical examination without abnormal findings: Secondary | ICD-10-CM | POA: Diagnosis not present

## 2024-12-27 NOTE — Patient Instructions (Signed)
 Mr. Gerstenberger,  Thank you for taking the time for your Medicare Wellness Visit. I appreciate your continued commitment to your health goals. Please review the care plan we discussed, and feel free to reach out if I can assist you further.  Please note that Annual Wellness Visits do not include a physical exam. Some assessments may be limited, especially if the visit was conducted virtually. If needed, we may recommend an in-person follow-up with your provider.  Ongoing Care Seeing your primary care provider every 3 to 6 months helps us  monitor your health and provide consistent, personalized care.   Referrals If a referral was made during today's visit and you haven't received any updates within two weeks, please contact the referred provider directly to check on the status.  Recommended Screenings:  Health Maintenance  Topic Date Due   COVID-19 Vaccine (1 - 2025-26 season) Never done   Medicare Annual Wellness Visit  10/05/2024   DTaP/Tdap/Td vaccine (8 - Td or Tdap) 09/03/2028   Pneumococcal Vaccine  Completed   Flu Shot  Completed   Hepatitis B Vaccine  Completed   Hepatitis C Screening  Completed   HIV Screening  Completed   Meningitis B Vaccine  Completed   HPV Vaccine  Discontinued       12/27/2024   11:16 AM  Advanced Directives  Does Patient Have a Medical Advance Directive? No  Would patient like information on creating a medical advance directive? No - Patient declined    Vision: Annual vision screenings are recommended for early detection of glaucoma, cataracts, and diabetic retinopathy. These exams can also reveal signs of chronic conditions such as diabetes and high blood pressure.  Dental: Annual dental screenings help detect early signs of oral cancer, gum disease, and other conditions linked to overall health, including heart disease and diabetes.  Please see the attached documents for additional preventive care recommendations.

## 2024-12-27 NOTE — Progress Notes (Signed)
 "  Chief Complaint  Patient presents with   Medicare Wellness    SUBESQUENT     Subjective:   Jesse Rhodes is a 24 y.o. male who presents for a Medicare Annual Wellness Visit.  Visit info / Clinical Intake: Medicare Wellness Visit Type:: Subsequent Annual Wellness Visit Persons participating in visit and providing information:: patient Medicare Wellness Visit Mode:: Telephone If telephone:: video declined Since this visit was completed virtually, some vitals may be partially provided or unavailable. Missing vitals are due to the limitations of the virtual format.: Documented vitals are patient reported If Telephone or Video please confirm:: I connected with patient using audio/video enable telemedicine. I verified patient identity with two identifiers, discussed telehealth limitations, and patient agreed to proceed. Patient Location:: HOME Provider Location:: OFFICE Interpreter Needed?: No Pre-visit prep was completed: yes AWV questionnaire completed by patient prior to visit?: no Living arrangements:: with family/others Patient's Overall Health Status Rating: excellent Typical amount of pain: none Does pain affect daily life?: no Are you currently prescribed opioids?: no  Dietary Habits and Nutritional Risks How many meals a day?: 3 Eats fruit and vegetables daily?: (!) no (goal is ti eat more fruits & vegetables) Most meals are obtained by: preparing own meals In the last 2 weeks, have you had any of the following?: none Diabetic:: no  Functional Status Activities of Daily Living (to include ambulation/medication): Independent Ambulation: Independent Medication Administration: Independent Home Management (perform basic housework or laundry): Independent Manage your own finances?: yes Primary transportation is: family / friends Concerns about vision?: no *vision screening is required for WTM* Concerns about hearing?: no  Fall Screening Falls in the past year?:  0 Number of falls in past year: 0 Was there an injury with Fall?: 0 Fall Risk Category Calculator: 0 Patient Fall Risk Level: Low Fall Risk  Fall Risk Patient at Risk for Falls Due to: No Fall Risks Fall risk Follow up: Falls evaluation completed; Education provided  Home and Transportation Safety: All rugs have non-skid backing?: N/A, no rugs All stairs or steps have railings?: N/A, no stairs Grab bars in the bathtub or shower?: (!) no Have non-skid surface in bathtub or shower?: (!) no Good home lighting?: yes Regular seat belt use?: yes Hospital stays in the last year:: no  Cognitive Assessment Difficulty concentrating, remembering, or making decisions? : no Will 6CIT or Mini Cog be Completed: yes What year is it?: 0 points What month is it?: 0 points Give patient an address phrase to remember (5 components): [address] [facility] About what time is it?: 0 points Count backwards from 20 to 1: 0 points Say the months of the year in reverse: 0 points Repeat the address phrase from earlier: 0 points 6 CIT Score: 0 points  Advance Directives (For Healthcare) Does Patient Have a Medical Advance Directive?: No Would patient like information on creating a medical advance directive?: No - Patient declined  Reviewed/Updated  Reviewed/Updated: Reviewed All (Medical, Surgical, Family, Medications, Allergies, Care Teams, Patient Goals)    Allergies (verified) Food, Advair diskus [fluticasone -salmeterol], Amoxicillin, Chocolate, and Penicillins   Current Medications (verified) Outpatient Encounter Medications as of 12/27/2024  Medication Sig   albuterol  (VENTOLIN  HFA) 108 (90 Base) MCG/ACT inhaler Inhale 2 puffs into the lungs every 6 (six) hours as needed. For wheeze or shortness of breath   azelastine  (ASTELIN ) 0.1 % nasal spray Place 2 sprays into both nostrils 2 (two) times daily. Use in each nostril as directed   EPINEPHrine  0.3 mg/0.3  mL IJ SOAJ injection Inject 0.3 mg into  the muscle See admin instructions.   escitalopram  (LEXAPRO ) 10 MG tablet Take 1 tablet (10 mg total) by mouth daily.   fluticasone  (FLONASE ) 50 MCG/ACT nasal spray 1-2 sprays in each nostril   fluticasone  (FLOVENT  HFA) 110 MCG/ACT inhaler 2 puffs   ipratropium-albuterol  (DUONEB) 0.5-2.5 (3) MG/3ML SOLN Take 3 mLs by nebulization every 6 (six) hours as needed.   mesalamine  (CANASA ) 1000 MG suppository UNWRAP AND INSERT 1 SUPPOSITORY(1000 MG) RECTALLY AT BEDTIME   montelukast  (SINGULAIR ) 10 MG tablet 1 tablet in the evening   Olopatadine HCl 0.2 % SOLN 1 drop into affected eye   promethazine -dextromethorphan (PROMETHAZINE -DM) 6.25-15 MG/5ML syrup Take 5 mLs by mouth 3 (three) times daily as needed for cough.   Spacer/Aero-Holding Chambers (AEROCHAMBER PLUS FLO-VU LARGE) MISC See admin instructions.   Wheat Dextrin (BENEFIBER) CHEW daily   No facility-administered encounter medications on file as of 12/27/2024.    History: Past Medical History:  Diagnosis Date   ADHD (attention deficit hyperactivity disorder)    Allergy    Anxiety    Asthma    Autism    Constipation    Depression    Hyperlipidemia    Past Surgical History:  Procedure Laterality Date   ADENOIDECTOMY     Family History  Problem Relation Age of Onset   Hypertension Mother    Colon cancer Paternal Uncle    Esophageal cancer Neg Hx    Liver disease Neg Hx    Rectal cancer Neg Hx    Stomach cancer Neg Hx    Social History   Occupational History   Occupation: airline pilot  Tobacco Use   Smoking status: Never   Smokeless tobacco: Never  Vaping Use   Vaping status: Never Used  Substance and Sexual Activity   Alcohol use: Never   Drug use: Never   Sexual activity: Never   Tobacco Counseling Counseling given: Not Answered  SDOH Screenings   Food Insecurity: No Food Insecurity (12/27/2024)  Housing: Low Risk (12/27/2024)  Transportation Needs: No Transportation Needs (12/27/2024)  Utilities: Not At Risk  (12/27/2024)  Alcohol Screen: Low Risk (10/06/2023)  Depression (PHQ2-9): Low Risk (12/27/2024)  Financial Resource Strain: Low Risk (10/06/2023)  Physical Activity: Sufficiently Active (12/27/2024)  Social Connections: Moderately Isolated (12/27/2024)  Stress: No Stress Concern Present (12/27/2024)  Tobacco Use: Low Risk (12/27/2024)  Health Literacy: Adequate Health Literacy (12/27/2024)   See flowsheets for full screening details  Depression Screen PHQ 2 & 9 Depression Scale- Over the past 2 weeks, how often have you been bothered by any of the following problems? Little interest or pleasure in doing things: 0 Feeling down, depressed, or hopeless (PHQ Adolescent also includes...irritable): 1 PHQ-2 Total Score: 1 Trouble falling or staying asleep, or sleeping too much: 0 Feeling tired or having little energy: 0 Poor appetite or overeating (PHQ Adolescent also includes...weight loss): 0 Feeling bad about yourself - or that you are a failure or have let yourself or your family down: 0 Trouble concentrating on things, such as reading the newspaper or watching television (PHQ Adolescent also includes...like school work): 0 Moving or speaking so slowly that other people could have noticed. Or the opposite - being so fidgety or restless that you have been moving around a lot more than usual: 0 Thoughts that you would be better off dead, or of hurting yourself in some way: 0 PHQ-9 Total Score: 1 If you checked off any problems, how difficult have these problems  made it for you to do your work, take care of things at home, or get along with other people?: Not difficult at all  Depression Treatment Depression Interventions/Treatment : [id number] Score <4 Follow-up Not Indicated     Goals Addressed             This Visit's Progress    12/27/2024: To eat more fruits and vegetables and increase exercise regimen.               Objective:    Today's Vitals   12/27/24 1115  Weight: 158 lb  (71.7 kg)  Height: 5' 8 (1.727 m)  PainSc: 0-No pain   Body mass index is 24.02 kg/m.  Hearing/Vision screen No results found. Immunizations and Health Maintenance Health Maintenance  Topic Date Due   COVID-19 Vaccine (1 - 2025-26 season) Never done   Medicare Annual Wellness (AWV)  12/27/2025   DTaP/Tdap/Td (8 - Td or Tdap) 09/03/2028   Pneumococcal Vaccine  Completed   Influenza Vaccine  Completed   Hepatitis B Vaccines 19-59 Average Risk  Completed   Hepatitis C Screening  Completed   HIV Screening  Completed   Meningococcal B Vaccine  Completed   HPV VACCINES  Discontinued        Assessment/Plan:  This is a routine wellness examination for Jesse Rhodes.  Patient Care Team: Vicci Barnie NOVAK, MD as PCP - General (Internal Medicine)  I have personally reviewed and noted the following in the patients chart:   Medical and social history Use of alcohol, tobacco or illicit drugs  Current medications and supplements including opioid prescriptions. Functional ability and status Nutritional status Physical activity Advanced directives List of other physicians Hospitalizations, surgeries, and ER visits in previous 12 months Vitals Screenings to include cognitive, depression, and falls Referrals and appointments  No orders of the defined types were placed in this encounter.  In addition, I have reviewed and discussed with patient certain preventive protocols, quality metrics, and best practice recommendations. A written personalized care plan for preventive services as well as general preventive health recommendations were provided to patient.   Roz LOISE Fuller, LPN   [id number]/79/7973   Return in about 1 year (around 12/27/2025) for Medicare wellness.  After Visit Summary: (MyChart) Due to this being a telephonic visit, the after visit summary with patients personalized plan was offered to patient via MyChart   Nurse Notes:  No voiced or noted concerns at this time HM  Addressed: Patient declined Covid-19 vaccine.  "

## 2025-03-16 ENCOUNTER — Ambulatory Visit: Admitting: Internal Medicine
